# Patient Record
Sex: Female | Born: 1950 | Race: Black or African American | Hispanic: No | Marital: Single | Smoking: Never smoker
Health system: Southern US, Community
[De-identification: ages and names within clinical notes are randomized; demographics above are authoritative.]

## PROBLEM LIST (undated history)

## (undated) DIAGNOSIS — I10 Essential (primary) hypertension: Secondary | ICD-10-CM

## (undated) DIAGNOSIS — K219 Gastro-esophageal reflux disease without esophagitis: Secondary | ICD-10-CM

## (undated) DIAGNOSIS — M4800 Spinal stenosis, site unspecified: Secondary | ICD-10-CM

## (undated) DIAGNOSIS — H269 Unspecified cataract: Secondary | ICD-10-CM

## (undated) DIAGNOSIS — M51369 Other intervertebral disc degeneration, lumbar region without mention of lumbar back pain or lower extremity pain: Secondary | ICD-10-CM

## (undated) DIAGNOSIS — B019 Varicella without complication: Secondary | ICD-10-CM

## (undated) DIAGNOSIS — M5126 Other intervertebral disc displacement, lumbar region: Secondary | ICD-10-CM

## (undated) DIAGNOSIS — M5136 Other intervertebral disc degeneration, lumbar region: Secondary | ICD-10-CM

## (undated) DIAGNOSIS — E119 Type 2 diabetes mellitus without complications: Secondary | ICD-10-CM

## (undated) HISTORY — DX: Other intervertebral disc degeneration, lumbar region: M51.36

## (undated) HISTORY — DX: Varicella without complication: B01.9

## (undated) HISTORY — DX: Unspecified cataract: H26.9

## (undated) HISTORY — DX: Other intervertebral disc degeneration, lumbar region without mention of lumbar back pain or lower extremity pain: M51.369

## (undated) HISTORY — DX: Spinal stenosis, site unspecified: M48.00

## (undated) HISTORY — DX: Other intervertebral disc displacement, lumbar region: M51.26

## (undated) HISTORY — PX: CARPAL TUNNEL RELEASE: SHX101

## (undated) HISTORY — DX: Gastro-esophageal reflux disease without esophagitis: K21.9

---

## 1981-03-10 HISTORY — PX: ABDOMINAL HYSTERECTOMY: SHX81

## 2003-04-23 ENCOUNTER — Ambulatory Visit (HOSPITAL_COMMUNITY): Admission: RE | Admit: 2003-04-23 | Discharge: 2003-04-23 | Payer: Self-pay | Admitting: Orthopedic Surgery

## 2017-12-25 ENCOUNTER — Ambulatory Visit (INDEPENDENT_AMBULATORY_CARE_PROVIDER_SITE_OTHER): Payer: Medicare Other

## 2017-12-25 ENCOUNTER — Other Ambulatory Visit: Payer: Self-pay | Admitting: Internal Medicine

## 2017-12-25 ENCOUNTER — Encounter: Payer: Self-pay | Admitting: Internal Medicine

## 2017-12-25 ENCOUNTER — Ambulatory Visit (INDEPENDENT_AMBULATORY_CARE_PROVIDER_SITE_OTHER): Payer: Medicare Other | Admitting: Internal Medicine

## 2017-12-25 VITALS — BP 140/80 | HR 72 | Temp 98.7°F | Ht 64.0 in | Wt 241.2 lb

## 2017-12-25 DIAGNOSIS — Z1329 Encounter for screening for other suspected endocrine disorder: Secondary | ICD-10-CM

## 2017-12-25 DIAGNOSIS — M5441 Lumbago with sciatica, right side: Secondary | ICD-10-CM | POA: Diagnosis not present

## 2017-12-25 DIAGNOSIS — E559 Vitamin D deficiency, unspecified: Secondary | ICD-10-CM | POA: Insufficient documentation

## 2017-12-25 DIAGNOSIS — R739 Hyperglycemia, unspecified: Secondary | ICD-10-CM

## 2017-12-25 DIAGNOSIS — G8929 Other chronic pain: Secondary | ICD-10-CM

## 2017-12-25 DIAGNOSIS — M5442 Lumbago with sciatica, left side: Secondary | ICD-10-CM

## 2017-12-25 DIAGNOSIS — R609 Edema, unspecified: Secondary | ICD-10-CM

## 2017-12-25 DIAGNOSIS — Z0000 Encounter for general adult medical examination without abnormal findings: Secondary | ICD-10-CM

## 2017-12-25 DIAGNOSIS — Z13220 Encounter for screening for lipoid disorders: Secondary | ICD-10-CM | POA: Diagnosis not present

## 2017-12-25 DIAGNOSIS — E2839 Other primary ovarian failure: Secondary | ICD-10-CM

## 2017-12-25 DIAGNOSIS — Z1159 Encounter for screening for other viral diseases: Secondary | ICD-10-CM

## 2017-12-25 DIAGNOSIS — Z1231 Encounter for screening mammogram for malignant neoplasm of breast: Secondary | ICD-10-CM

## 2017-12-25 DIAGNOSIS — Z13818 Encounter for screening for other digestive system disorders: Secondary | ICD-10-CM

## 2017-12-25 DIAGNOSIS — Z1389 Encounter for screening for other disorder: Secondary | ICD-10-CM

## 2017-12-25 LAB — COMPREHENSIVE METABOLIC PANEL
ALT: 38 U/L — ABNORMAL HIGH (ref 0–35)
AST: 24 U/L (ref 0–37)
Albumin: 4.3 g/dL (ref 3.5–5.2)
Alkaline Phosphatase: 85 U/L (ref 39–117)
BUN: 19 mg/dL (ref 6–23)
CHLORIDE: 104 meq/L (ref 96–112)
CO2: 27 mEq/L (ref 19–32)
Calcium: 9.5 mg/dL (ref 8.4–10.5)
Creatinine, Ser: 0.82 mg/dL (ref 0.40–1.20)
GFR: 89.52 mL/min (ref >60.00)
GLUCOSE: 142 mg/dL — AB (ref 70–99)
POTASSIUM: 4 meq/L (ref 3.5–5.1)
SODIUM: 140 meq/L (ref 135–145)
Total Bilirubin: 0.7 mg/dL (ref 0.2–1.2)
Total Protein: 8.3 g/dL (ref 6.0–8.3)

## 2017-12-25 LAB — CBC WITH DIFFERENTIAL/PLATELET
BASOS PCT: 0.3 % (ref 0.0–3.0)
Basophils Absolute: 0 10*3/uL (ref 0.0–0.1)
EOS PCT: 0.7 % (ref 0.0–5.0)
Eosinophils Absolute: 0.1 10*3/uL (ref 0.0–0.7)
HCT: 38.5 % (ref 36.0–46.0)
HEMOGLOBIN: 12.6 g/dL (ref 12.0–15.0)
Lymphocytes Relative: 13.4 % (ref 12.0–46.0)
Lymphs Abs: 1.2 10*3/uL (ref 0.7–4.0)
MCHC: 32.7 g/dL (ref 30.0–36.0)
MCV: 86.6 fl (ref 78.0–100.0)
MONOS PCT: 5.4 % (ref 3.0–12.0)
Monocytes Absolute: 0.5 10*3/uL (ref 0.1–1.0)
Neutro Abs: 7.1 10*3/uL (ref 1.4–7.7)
Neutrophils Relative %: 80.2 % — ABNORMAL HIGH (ref 43.0–77.0)
Platelets: 315 10*3/uL (ref 150.0–400.0)
RBC: 4.44 Mil/uL (ref 3.87–5.11)
RDW: 14.7 % (ref 11.5–15.5)
WBC: 8.8 10*3/uL (ref 4.0–10.5)

## 2017-12-25 LAB — HEMOGLOBIN A1C: HEMOGLOBIN A1C: 6.5 % (ref 4.6–6.5)

## 2017-12-25 LAB — LIPID PANEL
CHOL/HDL RATIO: 3
Cholesterol: 122 mg/dL (ref 0–200)
HDL: 36.8 mg/dL — ABNORMAL LOW (ref >39.00)
LDL CALC: 75 mg/dL (ref 0–99)
NONHDL: 85.42
Triglycerides: 51 mg/dL (ref 0.0–149.0)
VLDL: 10.2 mg/dL (ref 0.0–40.0)

## 2017-12-25 LAB — TSH: TSH: 1.26 u[IU]/mL (ref 0.35–4.50)

## 2017-12-25 LAB — VITAMIN D 25 HYDROXY (VIT D DEFICIENCY, FRACTURES): VITD: 16.16 ng/mL — ABNORMAL LOW (ref 30.00–100.00)

## 2017-12-25 MED ORDER — CHOLECALCIFEROL 1.25 MG (50000 UT) PO CAPS
50000.0000 [IU] | ORAL_CAPSULE | ORAL | 1 refills | Status: DC
Start: 2017-12-25 — End: 2019-02-18

## 2017-12-25 MED ORDER — FUROSEMIDE 20 MG PO TABS: 10.0000 mg | ORAL_TABLET | Freq: Every day | ORAL | 2 refills | Status: DC | PRN

## 2017-12-25 NOTE — Patient Instructions (Signed)
Tylenol 500 mg up to 6 pills a day is safer  Consider prescription compression stockings if the OTC ones dont work   Paresthesia Paresthesia is an abnormal burning or prickling sensation. This sensation is generally felt in the hands, arms, legs, or feet. However, it may occur in any part of the body. Usually, it is not painful. The feeling may be described as:  Tingling or numbness.  Pins and needles.  Skin crawling.  Buzzing.  Limbs falling asleep.  Itching.  Most people experience temporary (transient) paresthesia at some time in their lives. Paresthesia may occur when you breathe too quickly (hyperventilation). It can also occur without any apparent cause. Commonly, paresthesia occurs when pressure is placed on a nerve. The sensation quickly goes away after the pressure is removed. For some people, however, paresthesia is a long-lasting (chronic) condition that is caused by an underlying disorder. If you continue to have paresthesia, you may need further medical evaluation. Follow these instructions at home: Watch your condition for any changes. Taking the following actions may help to lessen any discomfort that you are feeling:  Avoid drinking alcohol.  Try acupuncture or massage to help relieve your symptoms.  Keep all follow-up visits as directed by your health care provider. This is important.  Contact a health care provider if:  You continue to have episodes of paresthesia.  Your burning or prickling feeling gets worse when you walk.  You have pain, cramps, or dizziness.  You develop a rash. Get help right away if:  You feel weak.  You have trouble walking or moving.  You have problems with speech, understanding, or vision.  You feel confused.  You cannot control your bladder or bowel movements.  You have numbness after an injury.  You faint. This information is not intended to replace advice given to you by your health care provider. Make sure you discuss  any questions you have with your health care provider. Document Released: 02/14/2002 Document Revised: 08/02/2015 Document Reviewed: 02/20/2014 Elsevier Interactive Patient Education  2018 [place].   Pneumococcal Conjugate Vaccine suspension for injection What is this medicine? PNEUMOCOCCAL VACCINE (NEU mo KOK al vak SEEN) is a vaccine used to prevent pneumococcus bacterial infections. These bacteria can cause serious infections like pneumonia, meningitis, and blood infections. This vaccine will lower your chance of getting pneumonia. If you do get pneumonia, it can make your symptoms milder and your illness shorter. This vaccine will not treat an infection and will not cause infection. This vaccine is recommended for infants and young children, adults with certain medical conditions, and adults 66 years or older. This medicine may be used for other purposes; ask your health care provider or pharmacist if you have questions. COMMON BRAND NAME(S): Prevnar, Prevnar 13 What should I tell my health care provider before I take this medicine? They need to know if you have any of these conditions: -bleeding problems -fever -immune system problems -an unusual or allergic reaction to pneumococcal vaccine, diphtheria toxoid, other vaccines, latex, other medicines, foods, dyes, or preservatives -pregnant or trying to get pregnant -breast-feeding How should I use this medicine? This vaccine is for injection into a muscle. It is given by a health care professional. A copy of Vaccine Information Statements will be given before each vaccination. Read this sheet carefully each time. The sheet may change frequently. Talk to your pediatrician regarding the use of this medicine in children. While this drug may be prescribed for children as young as 1 weeks old  for selected conditions, precautions do apply. Overdosage: If you think you have taken too much of this medicine contact a poison control center or  emergency room at once. NOTE: This medicine is only for you. Do not share this medicine with others. What if I miss a dose? It is important not to miss your dose. Call your doctor or health care professional if you are unable to keep an appointment. What may interact with this medicine? -medicines for cancer chemotherapy -medicines that suppress your immune function -steroid medicines like prednisone or cortisone This list may not describe all possible interactions. Give your health care provider a list of all the medicines, herbs, non-prescription drugs, or dietary supplements you use. Also tell them if you smoke, drink alcohol, or use illegal drugs. Some items may interact with your medicine. What should I watch for while using this medicine? Mild fever and pain should go away in 3 days or less. Report any unusual symptoms to your doctor or health care professional. What side effects may I notice from receiving this medicine? Side effects that you should report to your doctor or health care professional as soon as possible: -allergic reactions like skin rash, itching or hives, swelling of the face, lips, or tongue -breathing problems -confused -fast or irregular heartbeat -fever over 102 degrees F -seizures -unusual bleeding or bruising -unusual muscle weakness Side effects that usually do not require medical attention (report to your doctor or health care professional if they continue or are bothersome): -aches and pains -diarrhea -fever of 102 degrees F or less -headache -irritable -loss of appetite -pain, tender at site where injected -trouble sleeping This list may not describe all possible side effects. Call your doctor for medical advice about side effects. You may report side effects to FDA at 1-800-FDA-1088. Where should I keep my medicine? This does not apply. This vaccine is given in a clinic, pharmacy, doctor's office, or other health care setting and will not be stored  at home. NOTE: This sheet is a summary. It may not cover all possible information. If you have questions about this medicine, talk to your doctor, pharmacist, or health care provider.  2018 Elsevier/Gold Standard (2013-12-01 10:27:27)  Recombinant Zoster (Shingles) Vaccine, RZV: What You Need to Know 1. Why get vaccinated? Shingles (also called herpes zoster, or just zoster) is a painful skin rash, often with blisters. Shingles is caused by the varicella zoster virus, the same virus that causes chickenpox. After you have chickenpox, the virus stays in your body and can cause shingles later in life. You can't catch shingles from another person. However, a person who has never had chickenpox (or chickenpox vaccine) could get chickenpox from someone with shingles. A shingles rash usually appears on one side of the face or body and heals within 2 to 4 weeks. Its main symptom is pain, which can be severe. Other symptoms can include fever, headache, chills and upset stomach. Very rarely, a shingles infection can lead to pneumonia, hearing problems, blindness, brain inflammation (encephalitis), or death. For about 1 person in 5, severe pain can continue even long after the rash has cleared up. This long-lasting pain is called post-herpetic neuralgia (PHN). Shingles is far more common in people 57 years of age and older than in younger people, and the risk increases with age. It is also more common in people whose immune system is weakened because of a disease such as cancer, or by drugs such as steroids or chemotherapy. At least 1 million people  a year in the [place] get shingles. 2. Shingles vaccine (recombinant) Recombinant shingles vaccine was approved by FDA in 2017 for the prevention of shingles. In clinical trials, it was more than 90% effective in preventing shingles. It can also reduce the likelihood of PHN. Two doses, 2 to 6 months apart, are recommended for adults 37 and older. This vaccine  is also recommended for people who have already gotten the live shingles vaccine (Zostavax). There is no live virus in this vaccine. 3. Some people should not get this vaccine Tell your vaccine provider if you:  Have any severe, life-threatening allergies. A person who has ever had a life-threatening allergic reaction after a dose of recombinant shingles vaccine, or has a severe allergy to any component of this vaccine, may be advised not to be vaccinated. Ask your health care provider if you want information about vaccine components.  Are pregnant or breastfeeding. There is not much information about use of recombinant shingles vaccine in pregnant or nursing women. Your healthcare provider might recommend delaying vaccination.  Are not feeling well. If you have a mild illness, such as a cold, you can probably get the vaccine today. If you are moderately or severely ill, you should probably wait until you recover. Your doctor can advise you.  4. Risks of a vaccine reaction With any medicine, including vaccines, there is a chance of reactions. After recombinant shingles vaccination, a person might experience:  Pain, redness, soreness, or swelling at the site of the injection  Headache, muscle aches, fever, shivering, fatigue  In clinical trials, most people got a sore arm with mild or moderate pain after vaccination, and some also had redness and swelling where they got the shot. Some people felt tired, had muscle pain, a headache, shivering, fever, stomach pain, or nausea. About 1 out of 6 people who got recombinant zoster vaccine experienced side effects that prevented them from doing regular activities. Symptoms went away on their own in about 2 to 3 days. Side effects were more common in younger people. You should still get the second dose of recombinant zoster vaccine even if you had one of these reactions after the first dose. Other things that could happen after this vaccine:  People  sometimes faint after medical procedures, including vaccination. Sitting or lying down for about 15 minutes can help prevent fainting and injuries caused by a fall. Tell your provider if you feel dizzy or have vision changes or ringing in the ears.  Some people get shoulder pain that can be more severe and longer-lasting than routine soreness that can follow injections. This happens very rarely.  Any medication can cause a severe allergic reaction. Such reactions to a vaccine are estimated at about 1 in a million doses, and would happen within a few minutes to a few hours after the vaccination. As with any medicine, there is a very remote chance of a vaccine causing a serious injury or death. The safety of vaccines is always being monitored. For more information, visit: http://www.aguilar.org/ 5. What if there is a serious problem? What should I look for?  Look for anything that concerns you, such as signs of a severe allergic reaction, very high fever, or unusual behavior. Signs of a severe allergic reaction can include hives, swelling of the face and throat, difficulty breathing, a fast heartbeat, dizziness, and weakness. These would usually start a few minutes to a few hours after the vaccination. What should I do?  If you think it is  a severe allergic reaction or other emergency that can't wait, call 9-1-1 and get to the nearest hospital. Otherwise, call your health care provider. Afterward, the reaction should be reported to the Vaccine Adverse Event Reporting System (VAERS). Your doctor should file this report, or you can do it yourself through the VAERS web site atwww.vaers.https://www.bray.com/ by calling 360-034-9911. VAERS does not give medical advice. 6. How can I learn more?  Ask your healthcare provider. He or she can give you the vaccine package insert or suggest other sources of information.  Call your local or state health department.  Contact the Centers for Disease Control and  Prevention (CDC): ? Call 619-756-8072 (1-800-CDC-INFO) or ? Visit the CDC's website at http://hunter.com/ CDC Vaccine Information Statement (VIS) Recombinant Zoster Vaccine (04/21/2016) This information is not intended to replace advice given to you by your health care provider. Make sure you discuss any questions you have with your health care provider. Document Released: 05/06/2016 Document Revised: 05/06/2016 Document Reviewed: 05/06/2016 Elsevier Interactive Patient Education  2018 [employer].  DTaP Vaccine (Diphtheria, Tetanus, and Pertussis): What You Need to Know 1. Why get vaccinated? Diphtheria, tetanus, and pertussis are serious diseases caused by bacteria. Diphtheria and pertussis are spread from person to person. Tetanus enters the body through cuts or wounds. DIPHTHERIA causes a thick covering in the back of the throat.  It can lead to breathing problems, paralysis, heart failure, and even death.  TETANUS (Lockjaw) causes painful tightening of the muscles, usually all over the body.  It can lead to "locking" of the jaw so the victim cannot open his mouth or swallow. Tetanus leads to death in up to 2 out of 10 cases.  PERTUSSIS (Whooping Cough) causes coughing spells so bad that it is hard for infants to eat, drink, or breathe. These spells can last for weeks.  It can lead to pneumonia, seizures (jerking and staring spells), brain damage, and death.  Diphtheria, tetanus, and pertussis vaccine (DTaP) can help prevent these diseases. Most children who are vaccinated with DTaP will be protected throughout childhood. Many more children would get these diseases if we stopped vaccinating. DTaP is a safer version of an older vaccine called DTP. DTP is no longer used in the [place]. 2. Who should get DTaP vaccine and when? Children should get 5 doses of DTaP vaccine, one dose at each of the following ages:  2 months  4 months  6 months  15-18 months  4-6  years  DTaP may be given at the same time as other vaccines. 3. Some children should not get DTaP vaccine or should wait  Children with minor illnesses, such as a cold, may be vaccinated. But children who are moderately or severely ill should usually wait until they recover before getting DTaP vaccine.  Any child who had a life-threatening allergic reaction after a dose of DTaP should not get another dose.  Any child who suffered a brain or nervous system disease within 7 days after a dose of DTaP should not get another dose.  Talk with your doctor if your child: ? had a seizure or collapsed after a dose of DTaP, ? cried non-stop for 3 hours or more after a dose of DTaP, ? had a fever over 105F after a dose of DTaP. Ask your doctor for more information. Some of these children should not get another dose of pertussis vaccine, but may get a vaccine without pertussis, called DT. 4. Older children and adults DTaP is not licensed  for adolescents, adults, or children 58 years of age and older. But older people still need protection. A vaccine called Tdap is similar to DTaP. A single dose of Tdap is recommended for people 11 through 66 years of age. Another vaccine, called Td, protects against tetanus and diphtheria, but not pertussis. It is recommended every 10 years. There are separate Vaccine Information Statements for these vaccines. 5. What are the risks from DTaP vaccine? Getting diphtheria, tetanus, or pertussis disease is much riskier than getting DTaP vaccine. However, a vaccine, like any medicine, is capable of causing serious problems, such as severe allergic reactions. The risk of DTaP vaccine causing serious harm, or death, is extremely small. Mild problems (common)  Fever (up to about 1 child in 4)  Redness or swelling where the shot was given (up to about 1 child in 4)  Soreness or tenderness where the shot was given (up to about 1 child in 4) These problems occur more often  after the 4th and 5th doses of the DTaP series than after earlier doses. Sometimes the 4th or 5th dose of DTaP vaccine is followed by swelling of the entire arm or leg in which the shot was given, lasting 1-7 days (up to about 1 child in 55). Other mild problems include:  Fussiness (up to about 1 child in 3)  Tiredness or poor appetite (up to about 1 child in 10)  Vomiting (up to about 1 child in 66) These problems generally occur 1-3 days after the shot. Moderate problems (uncommon)  Seizure (jerking or staring) (about 1 child out of 14,000)  Non-stop crying, for 3 hours or more (up to about 1 child out of 1,000)  High fever, over 105F (about 1 child out of 16,000) Severe problems (very rare)  Serious allergic reaction (less than 1 out of a million doses)  Several other severe problems have been reported after DTaP vaccine. These include: ? Long-term seizures, coma, or lowered consciousness ? Permanent brain damage. These are so rare it is hard to tell if they are caused by the vaccine. Controlling fever is especially important for children who have had seizures, for any reason. It is also important if another family member has had seizures. You can reduce fever and pain by giving your child an aspirin-free pain reliever when the shot is given, and for the next 24 hours, following the package instructions. 6. What if there is a serious reaction? What should I look for? Look for anything that concerns you, such as signs of a severe allergic reaction, very high fever, or behavior changes. Signs of a severe allergic reaction can include hives, swelling of the face and throat, difficulty breathing, a fast heartbeat, dizziness, and weakness. These would start a few minutes to a few hours after the vaccination. What should I do?  If you think it is a severe allergic reaction or other emergency that can't wait, call 9-1-1 or get the person to the nearest hospital. Otherwise, call your  doctor.  Afterward, the reaction should be reported to the Vaccine Adverse Event Reporting System (VAERS). Your doctor might file this report, or you can do it yourself through the VAERS web site at www.vaers.SamedayNews.es, or by calling 838-753-7832. ? VAERS is only for reporting reactions. They do not give medical advice. 7. The National Vaccine Injury Compensation Program The [employer] Vaccine Injury Compensation Program (VICP) is a federal program that was created to compensate people who may have been injured by certain vaccines. Persons who  believe they may have been injured by a vaccine can learn about the program and about filing a claim by calling 305 393 2535 or visiting the [employer] website at GoldCloset.com.ee. 8. How can I learn more?  Ask your doctor.  Call your local or state health department.  Contact the Centers for Disease Control and Prevention (CDC): ? Call (618)206-5841 (1-800-CDC-INFO) or ? Visit CDC's website at http://hunter.com/ CDC DTaP Vaccine (Diphtheria, Tetanus, and Pertussis) VIS (07/24/05) This information is not intended to replace advice given to you by your health care provider. Make sure you discuss any questions you have with your health care provider. Document Released: 12/22/2005 Document Revised: 11/15/2015 Document Reviewed: 11/15/2015 Elsevier Interactive Patient Education  2017 [employer]  Edema Edema is an abnormal buildup of fluids in your bodytissues. Edema is somewhatdependent on gravity to pull the fluid to the lowest place in your body. That makes the condition more common in the legs and thighs (lower extremities). Painless swelling of the feet and ankles is common and becomes more likely as you get older. It is also common in looser tissues, like around your eyes. When the affected area is squeezed, the fluid may move out of that spot and leave a dent for a few moments. This dent is called pitting. What are the causes? There  are many possible causes of edema. Eating too much salt and being on your feet or sitting for a long time can cause edema in your legs and ankles. Hot weather may make edema worse. Common medical causes of edema include:  Heart failure.  Liver disease.  Kidney disease.  Weak blood vessels in your legs.  Cancer.  An injury.  Pregnancy.  Some medications.  Obesity.  What are the signs or symptoms? Edema is usually painless.Your skin may look swollen or shiny. How is this diagnosed? Your health care provider may be able to diagnose edema by asking about your medical history and doing a physical exam. You may need to have tests such as X-rays, an electrocardiogram, or blood tests to check for medical conditions that may cause edema. How is this treated? Edema treatment depends on the cause. If you have heart, liver, or kidney disease, you need the treatment appropriate for these conditions. General treatment may include:  Elevation of the affected body part above the level of your heart.  Compression of the affected body part. Pressure from elastic bandages or support stockings squeezes the tissues and forces fluid back into the blood vessels. This keeps fluid from entering the tissues.  Restriction of fluid and salt intake.  Use of a water pill (diuretic). These medications are appropriate only for some types of edema. They pull fluid out of your body and make you urinate more often. This gets rid of fluid and reduces swelling, but diuretics can have side effects. Only use diuretics as directed by your health care provider.  Follow these instructions at home:  Keep the affected body part above the level of your heart when you are lying down.  Do not sit still or stand for prolonged periods.  Do not put anything directly under your knees when lying down.  Do not wear constricting clothing or garters on your upper legs.  Exercise your legs to work the fluid back into your  blood vessels. This may help the swelling go down.  Wear elastic bandages or support stockings to reduce ankle swelling as directed by your health care provider.  Eat a low-salt diet to reduce fluid if  your health care provider recommends it.  Only take medicines as directed by your health care provider. Contact a health care provider if:  Your edema is not responding to treatment.  You have heart, liver, or kidney disease and notice symptoms of edema.  You have edema in your legs that does not improve after elevating them.  You have sudden and unexplained weight gain. Get help right away if:  You develop shortness of breath or chest pain.  You cannot breathe when you lie down.  You develop pain, redness, or warmth in the swollen areas.  You have heart, liver, or kidney disease and suddenly get edema.  You have a fever and your symptoms suddenly get worse. This information is not intended to replace advice given to you by your health care provider. Make sure you discuss any questions you have with your health care provider. Document Released: 02/24/2005 Document Revised: 08/02/2015 Document Reviewed: 12/17/2012 Elsevier Interactive Patient Education  2017 [employer].

## 2017-12-25 NOTE — Progress Notes (Signed)
Chief Complaint  Patient presents with  . Establish Care   NP 1. C/o intermittent low back pain worse x 1-2 years 8/10 worse days with numbness and tingling b/l legs/feet no other alarm sx's saddle anesthesia, bowel/bladder incontinence, falls/leg weakness tried Aleve  2. C/o leg edema intermittently nothing tried     Review of Systems  Constitutional: Negative for weight loss.  HENT: Negative for hearing loss.   Eyes: Negative for blurred vision.  Respiratory: Negative for shortness of breath.   Cardiovascular: Positive for leg swelling. Negative for chest pain.  Gastrointestinal: Negative for abdominal pain.  Musculoskeletal: Negative for falls.  Skin: Negative for rash.  Neurological: Positive for sensory change.  Psychiatric/Behavioral: Negative for depression.   Past Medical History:  Diagnosis Date  . Chickenpox   . GERD (gastroesophageal reflux disease)    Past Surgical History:  Procedure Laterality Date  . ABDOMINAL HYSTERECTOMY  1983   Family History  Problem Relation Age of Onset  . Cancer Father        pancreatic cancer   . Cancer Sister        breast cancer age 1s  . Diabetes Sister   . Cancer Brother        pancreatic cancer  . Stroke Brother        died stroke  . ALS Mother        32s   Social History   Socioeconomic History  . Marital status: Married    Spouse name: Not on file  . Number of children: Not on file  . Years of education: Not on file  . Highest education level: Not on file  Occupational History  . Not on file  Social Needs  . Financial resource strain: Not on file  . Food insecurity:    Worry: Not on file    Inability: Not on file  . Transportation needs:    Medical: Not on file    Non-medical: Not on file  Tobacco Use  . Smoking status: Never Smoker  . Smokeless tobacco: Never Used  Substance and Sexual Activity  . Alcohol use: Yes    Comment: social  . Drug use: Never  . Sexual activity: Not Currently  Lifestyle   . Physical activity:    Days per week: Not on file    Minutes per session: Not on file  . Stress: Not on file  Relationships  . Social connections:    Talks on phone: Not on file    Gets together: Not on file    Attends religious service: Not on file    Active member of club or organization: Not on file    Attends meetings of clubs or organizations: Not on file    Relationship status: Not on file  . Intimate partner violence:    Fear of current or ex partner: Not on file    Emotionally abused: Not on file    Physically abused: Not on file    Forced sexual activity: Not on file  Other Topics Concern  . 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 naproxen sodium (ALEVE) 220 MG tablet Take 220 mg by mouth.  No Known Allergies No results found for this or any previous visit (from the past 2160 hour(s)). Objective  Body mass index is 41.4 kg/m. Wt Readings from Last 3 Encounters:  12/25/17 241 lb 3.2 oz (109.4 kg)   Temp Readings from Last 3 Encounters:  12/25/17 98.7 F (37.1 C) (Oral)   BP Readings from Last 3 Encounters:  12/25/17 140/80   Pulse Readings from Last 3 Encounters:  12/25/17 72    Physical Exam  Constitutional: She is oriented to person, place, and time. Vital signs are normal. She appears well-developed and well-nourished. She is cooperative.  HENT:  Head: Normocephalic and atraumatic.  Mouth/Throat: Oropharynx is clear and moist and mucous membranes are normal.  Eyes: Pupils are equal, round, and reactive to light. Conjunctivae are normal.  Cardiovascular: Normal rate, regular rhythm and normal heart sounds.  No leg edema today   Pulmonary/Chest: Effort normal and breath sounds normal.  Musculoskeletal:       Lumbar back: She exhibits no tenderness.   Neg str8 leg test b/l   Neurological: She is alert and oriented to person, place, and time. Gait normal.  Skin: Skin is warm, dry and intact.  Psychiatric: She has a normal mood and affect. Her speech is normal and behavior is normal. Judgment and thought content normal. Cognition and memory are normal.  Nursing note and vitals reviewed.   Assessment   1. Numbness tingling b/l legs r/o back etiology, DM 2 2. Leg edema  3. HM Plan   1. Low back Xray today  Fasting labs  2. Prn lasix 10 mg qd prn disc otc compression  3.  Declines flu shot, Tdap Disc shingles, prevnar consider pna 23 given info   Referred mammogram and DEXA GI breast center Records colonoscopy [facility]  Pap s/p hysterectomy no h/o abnormal pap last 6-7 years ago ovaries intact  Never smoker   Provider: Dr. Olivia Phillips [facility]

## 2017-12-26 LAB — HEPATITIS C ANTIBODY
Hepatitis C Ab: NONREACTIVE
SIGNAL TO CUT-OFF: 0.03 (ref <1.00)

## 2017-12-26 LAB — URINALYSIS, ROUTINE W REFLEX MICROSCOPIC
BILIRUBIN URINE: NEGATIVE
GLUCOSE, UA: NEGATIVE
HGB URINE DIPSTICK: NEGATIVE
KETONES UR: NEGATIVE
Leukocytes, UA: NEGATIVE
Nitrite: NEGATIVE
Protein, ur: NEGATIVE
Specific Gravity, Urine: 1.031 (ref 1.001–1.03)
pH: 5.5 (ref 5.0–8.0)

## 2017-12-26 LAB — HEPATITIS B SURFACE ANTIBODY, QUANTITATIVE: Hepatitis B-Post: <5 m[IU]/mL — ABNORMAL LOW (ref >=10)

## 2017-12-28 ENCOUNTER — Telehealth: Payer: Self-pay | Admitting: Internal Medicine

## 2017-12-28 ENCOUNTER — Other Ambulatory Visit: Payer: Self-pay | Admitting: Internal Medicine

## 2017-12-28 DIAGNOSIS — M5416 Radiculopathy, lumbar region: Secondary | ICD-10-CM

## 2017-12-28 DIAGNOSIS — E119 Type 2 diabetes mellitus without complications: Secondary | ICD-10-CM | POA: Insufficient documentation

## 2017-12-28 DIAGNOSIS — R748 Abnormal levels of other serum enzymes: Secondary | ICD-10-CM | POA: Insufficient documentation

## 2017-12-28 MED ORDER — METFORMIN HCL 500 MG PO TABS: 500.0000 mg | ORAL_TABLET | Freq: Every day | ORAL | 3 refills | Status: DC

## 2017-12-28 NOTE — Telephone Encounter (Signed)
See result note.  

## 2017-12-28 NOTE — Telephone Encounter (Signed)
Copied from [place] [id number]. Topic: Quick Communication - Lab Results (Clinic Use ONLY) >> Dec 28, 2017 11:00 AM Babs Bertin, CMA wrote: Called patient to inform them of 21OCT2019 lab results. When patient returns call, triage nurse may disclose results.   Patient is calling and states to please call after 3:30pm.

## 2018-01-01 ENCOUNTER — Ambulatory Visit
Admission: RE | Admit: 2018-01-01 | Discharge: 2018-01-01 | Disposition: A | Discharge status: Home / Self Care | Payer: Medicare Other | Source: Ambulatory Visit | Attending: Internal Medicine | Admitting: Internal Medicine

## 2018-01-01 DIAGNOSIS — R748 Abnormal levels of other serum enzymes: Secondary | ICD-10-CM

## 2018-01-03 ENCOUNTER — Ambulatory Visit
Admission: RE | Admit: 2018-01-03 | Discharge: 2018-01-03 | Disposition: A | Discharge status: Home / Self Care | Payer: Medicare Other | Source: Ambulatory Visit | Attending: Internal Medicine | Admitting: Internal Medicine

## 2018-01-03 DIAGNOSIS — M5416 Radiculopathy, lumbar region: Secondary | ICD-10-CM

## 2018-02-03 ENCOUNTER — Ambulatory Visit
Admission: RE | Admit: 2018-02-03 | Discharge: 2018-02-03 | Disposition: A | Discharge status: Home / Self Care | Payer: Medicare Other | Source: Ambulatory Visit | Attending: Internal Medicine | Admitting: Internal Medicine

## 2018-02-03 ENCOUNTER — Encounter: Payer: Self-pay | Admitting: Internal Medicine

## 2018-02-03 ENCOUNTER — Ambulatory Visit (INDEPENDENT_AMBULATORY_CARE_PROVIDER_SITE_OTHER): Payer: Medicare Other | Admitting: Internal Medicine

## 2018-02-03 ENCOUNTER — Telehealth: Payer: Self-pay | Admitting: Internal Medicine

## 2018-02-03 VITALS — BP 134/62 | HR 64 | Temp 98.0°F | Ht 64.0 in | Wt 237.8 lb

## 2018-02-03 DIAGNOSIS — Z6841 Body Mass Index (BMI) 40.0 and over, adult: Secondary | ICD-10-CM

## 2018-02-03 DIAGNOSIS — R937 Abnormal findings on diagnostic imaging of other parts of musculoskeletal system: Secondary | ICD-10-CM | POA: Diagnosis not present

## 2018-02-03 DIAGNOSIS — E2839 Other primary ovarian failure: Secondary | ICD-10-CM

## 2018-02-03 DIAGNOSIS — K760 Fatty (change of) liver, not elsewhere classified: Secondary | ICD-10-CM

## 2018-02-03 DIAGNOSIS — Z1231 Encounter for screening mammogram for malignant neoplasm of breast: Secondary | ICD-10-CM

## 2018-02-03 DIAGNOSIS — M5416 Radiculopathy, lumbar region: Secondary | ICD-10-CM | POA: Insufficient documentation

## 2018-02-03 DIAGNOSIS — M5136 Other intervertebral disc degeneration, lumbar region: Secondary | ICD-10-CM | POA: Diagnosis not present

## 2018-02-03 DIAGNOSIS — M48062 Spinal stenosis, lumbar region with neurogenic claudication: Secondary | ICD-10-CM

## 2018-02-03 DIAGNOSIS — M4800 Spinal stenosis, site unspecified: Secondary | ICD-10-CM | POA: Insufficient documentation

## 2018-02-03 DIAGNOSIS — R609 Edema, unspecified: Secondary | ICD-10-CM

## 2018-02-03 DIAGNOSIS — E559 Vitamin D deficiency, unspecified: Secondary | ICD-10-CM

## 2018-02-03 NOTE — Telephone Encounter (Signed)
Copied from [place] [id number]. Topic: General - Other >> Feb 03, 2018  5:11 PM Yvette Rack wrote: Reason for CRM: Pt returned call for lab results. Pt requests call back. Cb# [phone]

## 2018-02-03 NOTE — Progress Notes (Signed)
Chief Complaint  Patient presents with  . Follow-up   F/u  1. Reviewed MRI abnormal herniated disc, DDD, severe spinal stenosis lumbar having numbness in b/l legs back pain worse with standing long periods  2. DM 2 on metformin 500 mg qd tolerating a1C 6.5 disc cinnamon to help with blood sugars  3 edema not tried lasix 10 mg qd yet  4. Vit D def on Vitamin D weekly    Review of Systems  Constitutional: Negative for weight loss.  HENT: Negative for hearing loss.   Eyes: Negative for blurred vision.  Respiratory: Negative for shortness of breath.   Cardiovascular: Negative for chest pain.  Gastrointestinal: Negative for abdominal pain.  Musculoskeletal: Positive for back pain.  Skin: Negative for rash.  Neurological: Positive for sensory change.  Psychiatric/Behavioral: Negative for depression.   Past Medical History:  Diagnosis Date  . Chickenpox   . GERD (gastroesophageal reflux disease)    Past Surgical History:  Procedure Laterality Date  . ABDOMINAL HYSTERECTOMY  1983   fibroids    Family History  Problem Relation Age of Onset  . Cancer Father        pancreatic cancer   . Cancer Sister        breast cancer age 61s  . Diabetes Sister   . Cancer Brother        pancreatic cancer  . Stroke Brother        died stroke  . ALS Mother        25s   Social History   Socioeconomic History  . Marital status: Married    Spouse name: Not on file  . Number of children: Not on file  . Years of education: Not on file  . Highest education level: Not on file  Occupational History  . Not on file  Social Needs  . Financial resource strain: Not on file  . Food insecurity:    Worry: Not on file    Inability: Not on file  . Transportation needs:    Medical: Not on file    Non-medical: Not on file  Tobacco Use  . Smoking status: Never Smoker  . Smokeless tobacco: Never Used  Substance and Sexual Activity  . Alcohol use: Yes    Comment: social  . Drug use: Never  .  Sexual activity: Not Currently  Lifestyle  . Physical activity:    Days per week: Not on file    Minutes per session: Not on file  . Stress: Not on file  Relationships  . Social connections:    Talks on phone: Not on file    Gets together: Not on file    Attends religious service: Not on file    Active member of club or organization: Not on file    Attends meetings of clubs or organizations: Not on file    Relationship status: Not on file  . Intimate partner violence:    Fear of current or ex partner: Not on file    Emotionally abused: Not on file    Physically abused: Not on file    Forced sexual activity: Not on file  Other Topics Concern  . 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 Cholecalciferol 50000 units capsule Take 1 capsule (50,000 Units total) by mouth once a week.  . furosemide (LASIX) 20 MG tablet Take 0.5 tablets (10 mg total) by mouth daily as needed.  . metFORMIN (GLUCOPHAGE) 500 MG tablet Take 1 tablet (500 mg total) by mouth daily with breakfast. [name]  . naproxen sodium (ALEVE) 220 MG tablet Take 220 mg by mouth.   No Known Allergies Recent Results (from the past 2160 hour(s))  Comprehensive metabolic panel     Status: Abnormal   Collection Time: 12/25/17 10:20 AM  Result Value Ref Range   Sodium 140 135 - 145 mEq/L   Potassium 4.0 3.5 - 5.1 mEq/L   Chloride 104 96 - 112 mEq/L   CO2 27 19 - 32 mEq/L   Glucose, Bld 142 (H) 70 - 99 mg/dL   BUN 19 6 - 23 mg/dL   Creatinine, Ser 0.82 0.40 - 1.20 mg/dL   Total Bilirubin 0.7 0.2 - 1.2 mg/dL   Alkaline Phosphatase 85 39 - 117 U/L   AST 24 0 - 37 U/L   ALT 38 (H) 0 - 35 U/L   Total Protein 8.3 6.0 - 8.3 g/dL   Albumin 4.3 3.5 - 5.2 g/dL   Calcium 9.5 8.4 - 10.5 mg/dL   GFR 89.52 >60.00 mL/min  CBC with  Differential/Platelet     Status: Abnormal   Collection Time: 12/25/17 10:20 AM  Result Value Ref Range   WBC 8.8 4.0 - 10.5 K/uL   RBC 4.44 3.87 - 5.11 Mil/uL   Hemoglobin 12.6 12.0 - 15.0 g/dL   HCT 38.5 36.0 - 46.0 %   MCV 86.6 78.0 - 100.0 fl   MCHC 32.7 30.0 - 36.0 g/dL   RDW 14.7 11.5 - 15.5 %   Platelets 315.0 150.0 - 400.0 K/uL   Neutrophils Relative % 80.2 (H) 43.0 - 77.0 %   Lymphocytes Relative 13.4 12.0 - 46.0 %   Monocytes Relative 5.4 3.0 - 12.0 %   Eosinophils Relative 0.7 0.0 - 5.0 %   Basophils Relative 0.3 0.0 - 3.0 %   Neutro Abs 7.1 1.4 - 7.7 K/uL   Lymphs Abs 1.2 0.7 - 4.0 K/uL   Monocytes Absolute 0.5 0.1 - 1.0 K/uL   Eosinophils Absolute 0.1 0.0 - 0.7 K/uL   Basophils Absolute 0.0 0.0 - 0.1 K/uL  Lipid panel     Status: Abnormal   Collection Time: 12/25/17 10:20 AM  Result Value Ref Range   Cholesterol 122 0 - 200 mg/dL    Comment: ATP III Classification       Desirable:  < 200 mg/dL               Borderline High:  200 - 239 mg/dL          High:  > = 240 mg/dL   Triglycerides 51.0 0.0 - 149.0 mg/dL    Comment: Normal:  <150 mg/dLBorderline High:  150 - 199 mg/dL   HDL 36.80 (L) >39.00 mg/dL   VLDL 10.2 0.0 - 40.0 mg/dL   LDL Cholesterol 75 0 - 99 mg/dL   Total CHOL/HDL Ratio 3     Comment:                Men          Women1/2 Average Risk     3.4          3.3Average Risk  5.0          4.42X Average Risk          9.6          7.13X Average Risk          15.0          11.0                       NonHDL 85.42     Comment: NOTE:  Non-HDL goal should be 30 mg/dL higher than patient's LDL goal (i.e. LDL goal of < 70 mg/dL, would have non-HDL goal of < 100 mg/dL)  TSH     Status: None   Collection Time: 12/25/17 10:20 AM  Result Value Ref Range   TSH 1.26 0.35 - 4.50 uIU/mL  Urinalysis, Routine w reflex microscopic     Status: Abnormal   Collection Time: 12/25/17 10:20 AM  Result Value Ref Range   Color, Urine YELLOW YELLOW   APPearance CLOUDY (A)  CLEAR   Specific Gravity, Urine 1.031 1.001 - 1.03   pH 5.5 5.0 - 8.0   Glucose, UA NEGATIVE NEGATIVE   Bilirubin Urine NEGATIVE NEGATIVE   Ketones, ur NEGATIVE NEGATIVE   Hgb urine dipstick NEGATIVE NEGATIVE   Protein, ur NEGATIVE NEGATIVE   Nitrite NEGATIVE NEGATIVE   Leukocytes, UA NEGATIVE NEGATIVE  Hemoglobin A1c     Status: None   Collection Time: 12/25/17 10:20 AM  Result Value Ref Range   Hgb A1c MFr Bld 6.5 4.6 - 6.5 %    Comment: Glycemic Control Guidelines for People with Diabetes:Non Diabetic:  <6%Goal of Therapy: <7%Additional Action Suggested:  >8%   Vitamin D (25 hydroxy)     Status: Abnormal   Collection Time: 12/25/17 10:20 AM  Result Value Ref Range   VITD 16.16 (L) 30.00 - 100.00 ng/mL  Hepatitis B surface antibody,quantitative     Status: Abnormal   Collection Time: 12/25/17 10:20 AM  Result Value Ref Range   Hepatitis B-Post <5 (L) > OR = 10 mIU/mL    Comment: . Patient does not have immunity to hepatitis B virus. . For additional information, please refer to http://education.[facility].com/faq/FAQ105 (This link is being provided for informational/ educational purposes only).   Hepatitis C antibody     Status: None   Collection Time: 12/25/17 10:20 AM  Result Value Ref Range   Hepatitis C Ab NON-REACTIVE NON-REACTI   SIGNAL TO CUT-OFF 0.03 <1.00    Comment: . HCV antibody was non-reactive. There is no laboratory  evidence of HCV infection. . In most cases, no further action is required. However, if recent HCV exposure is suspected, a test for HCV RNA (test code [phone]) is suggested. . For additional information please refer to http://education.[facility].com/faq/FAQ22v1 (This link is being provided for informational/ educational purposes only.) .    Objective  Body mass index is 40.82 kg/m. Wt Readings from Last 3 Encounters:  02/03/18 237 lb 12.8 oz (107.9 kg)  12/25/17 241 lb 3.2 oz (109.4 kg)   Temp Readings from Last 3  Encounters:  02/03/18 98 F (36.7 C) (Oral)  12/25/17 98.7 F (37.1 C) (Oral)   BP Readings from Last 3 Encounters:  02/03/18 134/62  12/25/17 140/80   Pulse Readings from Last 3 Encounters:  02/03/18 64  12/25/17 72    Physical Exam  Constitutional: She is oriented to person, place, and time. Vital signs are normal. She appears well-developed and well-nourished. She is cooperative.  HENT:  Head: Normocephalic and atraumatic.  Mouth/Throat: Oropharynx is clear and moist and mucous membranes are normal.  Eyes: Pupils are equal, round, and reactive to light. Conjunctivae are normal.  Cardiovascular: Normal rate, regular rhythm and normal heart sounds.  Pulmonary/Chest: Effort normal and breath sounds normal.  Neurological: She is alert and oriented to person, place, and time. Gait normal.  Skin: Skin is warm, dry and intact.  Psychiatric: She has a normal mood and affect. Her speech is normal and behavior is normal. Judgment and thought content normal. Cognition and memory are normal.  Nursing note and vitals reviewed.   Assessment   1. Herniated disc, DDD, severe spinal stenosis with numbness b/l legs  2. DM 2 A1C 6.5  3. Leg edema stable  4. Vit D def  5. HM Plan   1. MRI reviewed  Refer to Dr. Lynann Bologna in [place]  2. Cont metformin  Do foot exam at f/u and disc eye exam, check labs and urine in 6 months Disc cinnamon and diet changes healthy reduce pepsi 3. Not tired lasix 10 mg qd prn  4. Weekly D3 then month 7 5000 IU daily  5.  Declines flu shot, Tdap Disc shingles, prevnar consider pna 23 given info  Consider twinrix vaccine +fatty liver  Referred mammogram and [facility] center appt today 4 and 4:30 pm  Records colonoscopy [facility] sign at f/u  Pap s/p hysterectomy no h/o abnormal pap last 6-7 years ago ovaries intact  Never smoker  Provider: Dr. [name] [facility]

## 2018-02-03 NOTE — Patient Instructions (Addendum)
Vitamin D3 month 7 5000 IU daily  Dr. Lynann Phillips in [facility] orthopedics   Diabetes Mellitus and Nutrition When you have diabetes (diabetes mellitus), it is very important to have healthy eating habits because your blood sugar (glucose) levels are greatly affected by what you eat and drink. Eating healthy foods in the appropriate amounts, at about the same times every day, can help you:  Control your blood glucose.  Lower your risk of heart disease.  Improve your blood pressure.  Reach or maintain a healthy weight.  Every person with diabetes is different, and each person has different needs for a meal plan. Your health care provider may recommend that you work with a diet and nutrition specialist (dietitian) to make a meal plan that is best for you. Your meal plan may vary depending on factors such as:  The calories you need.  The medicines you take.  Your weight.  Your blood glucose, blood pressure, and cholesterol levels.  Your activity level.  Other health conditions you have, such as heart or kidney disease.  How do carbohydrates affect me? Carbohydrates affect your blood glucose level more than any other type of food. Eating carbohydrates naturally increases the amount of glucose in your blood. Carbohydrate counting is a method for keeping track of how many carbohydrates you eat. Counting carbohydrates is important to keep your blood glucose at a healthy level, especially if you use insulin or take certain oral diabetes medicines. It is important to know how many carbohydrates you can safely have in each meal. This is different for every person. Your dietitian can help you calculate how many carbohydrates you should have at each meal and for snack. Foods that contain carbohydrates include:  Bread, cereal, rice, pasta, and crackers.  Potatoes and corn.  Peas, beans, and lentils.  Milk and yogurt.  Fruit and juice.  Desserts, such as cakes, cookies, ice cream,  and candy.  How does alcohol affect me? Alcohol can cause a sudden decrease in blood glucose (hypoglycemia), especially if you use insulin or take certain oral diabetes medicines. Hypoglycemia can be a life-threatening condition. Symptoms of hypoglycemia (sleepiness, dizziness, and confusion) are similar to symptoms of having too much alcohol. If your health care provider says that alcohol is safe for you, follow these guidelines:  Limit alcohol intake to no more than 1 drink per day for nonpregnant women and 2 drinks per day for men. One drink equals 12 oz of beer, 5 oz of wine, or 1 oz of hard liquor.  Do not drink on an empty stomach.  Keep yourself hydrated with water, diet soda, or unsweetened iced tea.  Keep in mind that regular soda, juice, and other mixers may contain a lot of sugar and must be counted as carbohydrates.  What are tips for following this plan? Reading food labels  Start by checking the serving size on the label. The amount of calories, carbohydrates, fats, and other nutrients listed on the label are based on one serving of the food. Many foods contain more than one serving per package.  Check the total grams (g) of carbohydrates in one serving. You can calculate the number of servings of carbohydrates in one serving by dividing the total carbohydrates by 15. For example, if a food has 30 g of total carbohydrates, it would be equal to 2 servings of carbohydrates.  Check the number of grams (g) of saturated and trans fats in one serving. Choose foods that have low or no amount of  these fats.  Check the number of milligrams (mg) of sodium in one serving. Most people should limit total sodium intake to less than 2,300 mg per day.  Always check the nutrition information of foods labeled as "low-fat" or "nonfat". These foods may be higher in added sugar or refined carbohydrates and should be avoided.  Talk to your dietitian to identify your daily goals for nutrients  listed on the label. Shopping  Avoid buying canned, premade, or processed foods. These foods tend to be high in fat, sodium, and added sugar.  Shop around the outside edge of the grocery store. This includes fresh fruits and vegetables, bulk grains, fresh meats, and fresh dairy. Cooking  Use low-heat cooking methods, such as baking, instead of high-heat cooking methods like deep frying.  Cook using healthy oils, such as olive, canola, or sunflower oil.  Avoid cooking with butter, cream, or high-fat meats. Meal planning  Eat meals and snacks regularly, preferably at the same times every day. Avoid going long periods of time without eating.  Eat foods high in fiber, such as fresh fruits, vegetables, beans, and whole grains. Talk to your dietitian about how many servings of carbohydrates you can eat at each meal.  Eat 4-6 ounces of lean protein each day, such as lean meat, chicken, fish, eggs, or tofu. 1 ounce is equal to 1 ounce of meat, chicken, or fish, 1 egg, or 1/4 cup of tofu.  Eat some foods each day that contain healthy fats, such as avocado, nuts, seeds, and fish. Lifestyle   Check your blood glucose regularly.  Exercise at least 30 minutes 5 or more days each week, or as told by your health care provider.  Take medicines as told by your health care provider.  Do not use any products that contain nicotine or tobacco, such as cigarettes and e-cigarettes. If you need help quitting, ask your health care provider.  Work with a Social worker or diabetes educator to identify strategies to manage stress and any emotional and social challenges. What are some questions to ask my health care provider?  Do I need to meet with a diabetes educator?  Do I need to meet with a dietitian?  What number can I call if I have questions?  When are the best times to check my blood glucose? Where to find more information:  American Diabetes Association:  diabetes.org/food-and-fitness/food  Academy of Nutrition and Dietetics: PokerClues.dk  [employer] of Diabetes and Digestive and Kidney Diseases (NIH): ContactWire.be Summary  A healthy meal plan will help you control your blood glucose and maintain a healthy lifestyle.  Working with a diet and nutrition specialist (dietitian) can help you make a meal plan that is best for you.  Keep in mind that carbohydrates and alcohol have immediate effects on your blood glucose levels. It is important to count carbohydrates and to use alcohol carefully. This information is not intended to replace advice given to you by your health care provider. Make sure you discuss any questions you have with your health care provider. Document Released: 11/21/2004 Document Revised: 03/31/2016 Document Reviewed: 03/31/2016 Elsevier Interactive Patient Education  2018 [place].  Herniated Disk A herniated disk is when a disk in your spine bulges out too far. There is a disk with a spongy center in between each pair of bones in the spine (vertebrae). These disks act as shock absorbers when you move. A herniated disk can cause pain and muscle weakness. This can happen anywhere in the back or  neck. Follow these instructions at home: Medicines  Take over-the-counter and prescription medicines only as told by your doctor.  Do not drive or use heavy machinery while taking prescription pain medicine. Activity  Rest as told by your doctor.  After your rest period: ? Return to your normal activities. Slowly start exercising as told by your doctor. Ask what activities are safe for you. ? Use good posture. ? Avoid movements that cause pain. ? Do not lift anything that is heavier than 10 lb (4.5 kg) until your doctor says this is safe. ? Do not sit or stand for a long time without  moving. ? Do not sit for a long time without getting up and moving around.  Do exercises (physical therapy) as told.  Try to strengthen your back and belly (abdomen) with exercises like crunches, swimming, or walking. General instructions  Do not use any products that contain nicotine or tobacco, such as cigarettes and e-cigarettes. If you need help quitting, ask your doctor.  Do not wear high-heeled shoes.  Do not sleep on your belly.  If you are overweight, work with your doctor to lose weight safely.  To prevent or treat constipation while you are taking prescription pain medicine, your doctor may recommend that you: ? Drink enough fluid to keep your pee (urine) clear or pale yellow. ? Take over-the-counter or prescription medicines. ? Eat foods that are high in fiber. These include fresh fruits and vegetables, whole grains, and beans. ? Limit foods that are high in fat and processed sugars. These include fried and sweet foods.  Keep all follow-up visits as told by your doctor. This is important. How is this prevented?  Stay at a healthy weight.  Try to avoid stress.  Stay in shape. Do at least 150 minutes of moderate-intensity exercise each week, such as fast walking or water aerobics.  When lifting objects: ? Keep your feet as far apart as your shoulders (shoulder-width apart) or farther apart. ? Tighten your belly muscles. ? Bend your knees and hips and keep your spine neutral. Lift using the strength of your legs, not your back. Do not lock your knees straight out. ? Always ask for help to lift heavy or awkward objects. Contact a doctor if:  You have back pain or neck pain that does not get better after 6 weeks.  You have very bad pain.  You get any of these problems in any part of your body: ? Tingling. ? Weakness. ? Loss of feeling (numbness). Get help right away if:  You cannot move your arms or legs.  You cannot control when you pee (urinate) or poop (have  a bowel movement).  You feel dizzy.  You faint.  You have trouble breathing. This information is not intended to replace advice given to you by your health care provider. Make sure you discuss any questions you have with your health care provider. Document Released: 07/11/2013 Document Revised: 10/24/2015 Document Reviewed: 08/23/2015 Elsevier Interactive Patient Education  2017 Elsevier Inc.  Fatty Liver Fatty liver, also called hepatic steatosis or steatohepatitis, is a condition in which too much fat has built up in your liver cells. The liver removes harmful substances from your bloodstream. It produces fluids your body needs. It also helps your body use and store energy from the food you eat. In many cases, fatty liver does not cause symptoms or problems. It is often diagnosed when tests are being done for other reasons. However, over time, fatty liver can  cause inflammation that may lead to more serious liver problems, such as scarring of the liver (cirrhosis). What are the causes? Causes of fatty liver may include:  Drinking too much alcohol.  Poor nutrition.  Obesity.  Cushing syndrome.  Diabetes.  Hyperlipidemia.  Pregnancy.  Certain drugs.  Poisons.  Some viral infections.  What increases the risk? You may be more likely to develop fatty liver if you:  Abuse alcohol.  Are pregnant.  Are overweight.  Have diabetes.  Have hepatitis.  Have a high triglyceride level.  What are the signs or symptoms? Fatty liver often does not cause any symptoms. In cases where symptoms develop, they can include:  Fatigue.  Weakness.  Weight loss.  Confusion.  Abdominal pain.  Yellowing of your skin and the white parts of your eyes (jaundice).  Nausea and vomiting.  How is this diagnosed? Fatty liver may be diagnosed by:  Physical exam and medical history.  Blood tests.  Imaging tests, such as an ultrasound, CT scan, or MRI.  Liver biopsy. A small  sample of liver tissue is removed using a needle. The sample is then looked at under a microscope.  How is this treated? Fatty liver is often caused by other health conditions. Treatment for fatty liver may involve medicines and lifestyle changes to manage conditions such as:  Alcoholism.  High cholesterol.  Diabetes.  Being overweight or obese.  Follow these instructions at home:  Eat a healthy diet as directed by your health care provider.  Exercise regularly. This can help you lose weight and control your cholesterol and diabetes. Talk to your health care provider about an exercise plan and which activities are best for you.  Do not drink alcohol.  Take medicines only as directed by your health care provider. Contact a health care provider if: You have difficulty controlling your:  Blood sugar.  Cholesterol.  Alcohol consumption.  Get help right away if:  You have abdominal pain.  You have jaundice.  You have nausea and vomiting. This information is not intended to replace advice given to you by your health care provider. Make sure you discuss any questions you have with your health care provider. Document Released: 04/11/2005 Document Revised: 08/02/2015 Document Reviewed: 07/06/2013 Elsevier Interactive Patient Education  2018 [employer].   Hepatitis A; Hepatitis B Vaccine injection-3 months over 6 months   What is this medicine? HEPATITIS A VACCINE; HEPATITIS B VACCINE (hep uh TAHY tis A vak SEEN; hep uh TAHY tis B vak SEEN) is a vaccine to protect from an infection with the hepatitis A and B virus. This vaccine does not contain the live viruses. It will not cause a hepatitis infection. This medicine may be used for other purposes; ask your health care provider or pharmacist if you have questions. COMMON BRAND NAME(S): Twinrix What should I tell my health care provider before I take this medicine? They need to know if you have any of these  conditions: -bleeding disorder -fever or infection -heart disease -immune system problems -an unusual or allergic reaction to hepatitis A or B vaccine, neomycin, yeast, thimerosal, other medicines, foods, dyes, or preservatives -pregnant or trying to get pregnant -breast-feeding How should I use this medicine? This vaccine is for injection into a muscle. It is given by a health care professional. A copy of Vaccine Information Statements will be given before each vaccination. Read this sheet carefully each time. The sheet may change frequently. Talk to your pediatrician regarding the use of this medicine  in children. Special care may be needed. Overdosage: If you think you have taken too much of this medicine contact a poison control center or emergency room at once. NOTE: This medicine is only for you. Do not share this medicine with others. What if I miss a dose? It is important not to miss your dose. Call your doctor or health care professional if you are unable to keep an appointment. What may interact with this medicine? -medicines that suppress your immune function like adalimumab, anakinra, infliximab -medicines to treat cancer -steroid medicines like prednisone or cortisone This list may not describe all possible interactions. Give your health care provider a list of all the medicines, herbs, non-prescription drugs, or dietary supplements you use. Also tell them if you smoke, drink alcohol, or use illegal drugs. Some items may interact with your medicine. What should I watch for while using this medicine? See your health care provider for all shots of this vaccine as directed. You must have 3 to 4 shots of this vaccine for protection from hepatitis A and B infection. Tell your doctor right away if you have any serious or unusual side effects after getting this vaccine. What side effects may I notice from receiving this medicine? Side effects that you should report to your doctor or  health care professional as soon as possible: -allergic reactions like skin rash, itching or hives, swelling of the face, lips, or tongue -breathing problems -confused, irritated -fast, irregular heartbeat -flu-like syndrome -numb, tingling pain -seizures Side effects that usually do not require medical attention (report to your doctor or health care professional if they continue or are bothersome): -diarrhea -fever -headache -loss of appetite -muscle pain -nausea -pain, redness, swelling, or irritation at site where injected -tiredness This list may not describe all possible side effects. Call your doctor for medical advice about side effects. You may report side effects to FDA at 1-800-FDA-1088. Where should I keep my medicine? This drug is given in a hospital or clinic and will not be stored at home. NOTE: This sheet is a summary. It may not cover all possible information. If you have questions about this medicine, talk to your doctor, pharmacist, or health care provider.  2018 Elsevier/Gold Standard (2007-07-09 15:21:37)   Spinal Stenosis Spinal stenosis occurs when the open space (spinal canal) between the bones of your spine (vertebrae) narrows, putting pressure on the spinal cord or nerves. What are the causes? This condition is caused by areas of bone pushing into the central canals of your vertebrae. This condition may be present at birth (congenital), or it may be caused by:  Arthritic deterioration of your vertebrae (spinal degeneration). This usually starts around age 2.  Injury or trauma to the spine.  Tumors in the spine.  Calcium deposits in the spine.  What are the signs or symptoms? Symptoms of this condition include:  Pain in the neck or back that is generally worse with activities, particularly when standing and walking.  Numbness, tingling, hot or cold sensations, weakness, or weariness in your legs.  Pain going up and down the leg  (sciatica).  Frequent episodes of falling.  A foot-slapping gait that leads to muscle weakness.  In more serious cases, you may develop:  Problemspassing stool or passing urine.  Difficulty having sex.  Loss of feeling in part or all of your leg.  Symptoms may come on slowly and get worse over time. How is this diagnosed? This condition is diagnosed based on your medical history and  a physical exam. Tests will also be done, such as:  MRI.  CT scan.  X-ray.  How is this treated? Treatment for this condition often focuses on managing your pain and any other symptoms. Treatment may include:  Practicing good posture to lessen pressure on your nerves.  Exercising to strengthen muscles, build endurance, improve balance, and maintain good joint movement (range of motion).  Losing weight, if needed.  Taking medicines to reduce swelling, inflammation, or pain.  Assistive devices, such as a corset or brace.  In some cases, surgery may be needed. The most common procedure is decompression laminectomy. This is done to remove excess bone that puts pressure on your nerve roots. Follow these instructions at home: Managing pain, stiffness, and swelling  Do all exercises and stretches as told by your health care provider.  Practice good posture. If you were given a brace or a corset, wear it as told by your health care provider.  Do not do any activities that cause pain. Ask your health care provider what activities are safe for you.  Do not lift anything that is heavier than 10 lb (4.5 kg) or the limit that your health care provider tells you.  Maintain a healthy weight. Talk with your health care provider if you need help losing weight.  If directed, apply heat to the affected area as often as told by your health care provider. Use the heat source that your health care provider recommends, such as a moist heat pack or a heating pad. ? Place a towel between your skin and the heat  source. ? Leave the heat on for 20-30 minutes. ? Remove the heat if your skin turns bright red. This is especially important if you are not able to feel pain, heat, or cold. You may have a greater risk of getting burned. General instructions  Take over-the-counter and prescription medicines only as told by your health care provider.  Do not use any products that contain nicotine or tobacco, such as cigarettes and e-cigarettes. If you need help quitting, ask your health care provider.  Eat a healthy diet. This includes plenty of fruits and vegetables, whole grains, and low-fat (lean) protein.  Keep all follow-up visits as told by your health care provider. This is important. Contact a health care provider if:  Your symptoms do not get better or they get worse.  You have a fever. Get help right away if:  You have new or worse pain in your neck or upper back.  You have severe pain that cannot be controlled with medicines.  You are dizzy.  You have vision problems, blurred vision, or double vision.  You have a severe headache that is worse when you stand.  You have nausea or you vomit.  You develop new or worse numbness or tingling in your back or legs.  You have pain, redness, swelling, or warmth in your arm or leg. Summary  Spinal stenosis occurs when the open space (spinal canal) between the bones of your spine (vertebrae) narrows. This narrowing puts pressure on the spinal cord or nerves.  Spinal stenosis can cause numbness, weakness, or pain in the neck, back, and legs.  This condition may be caused by a birth defect, arthritic deterioration of your vertebrae, injury, tumors, or calcium deposits.  This condition is usually diagnosed with MRIs, CT scans, and X-rays. This information is not intended to replace advice given to you by your health care provider. Make sure you discuss any questions you  have with your health care provider. Document Released: 05/17/2003 Document  Revised: 01/30/2016 Document Reviewed: 01/30/2016 Elsevier Interactive Patient Education  2018 [employer].   Back Pain, Adult Many adults have back pain from time to time. Common causes of back pain include:  A strained muscle or ligament.  Wear and tear (degeneration) of the spinal disks.  Arthritis.  A hit to the back.  Back pain can be short-lived (acute) or last a long time (chronic). A physical exam, lab tests, and imaging studies may be done to find the cause of your pain. Follow these instructions at home: Managing pain and stiffness  Take over-the-counter and prescription medicines only as told by your health care provider.  If directed, apply heat to the affected area as often as told by your health care provider. Use the heat source that your health care provider recommends, such as a moist heat pack or a heating pad. ? Place a towel between your skin and the heat source. ? Leave the heat on for 20-30 minutes. ? Remove the heat if your skin turns bright red. This is especially important if you are unable to feel pain, heat, or cold. You have a greater risk of getting burned.  If directed, apply ice to the injured area: ? Put ice in a plastic bag. ? Place a towel between your skin and the bag. ? Leave the ice on for 20 minutes, 2-3 times a day for the first 2-3 days. Activity  Do not stay in bed. Resting more than 1-2 days can delay your recovery.  Take short walks on even surfaces as soon as you are able. Try to increase the length of time you walk each day.  Do not sit, drive, or stand in one place for more than 30 minutes at a time. Sitting or standing for long periods of time can put stress on your back.  Use proper lifting techniques. When you bend and lift, use positions that put less stress on your back: ? [place] your knees. ? Keep the load close to your body. ? Avoid twisting.  Exercise regularly as told by your health care provider. Exercising will help  your back heal faster. This also helps prevent back injuries by keeping muscles strong and flexible.  Your health care provider may recommend that you see a physical therapist. This person can help you come up with a safe exercise program. Do any exercises as told by your physical therapist. Lifestyle  Maintain a healthy weight. Extra weight puts stress on your back and makes it difficult to have good posture.  Avoid activities or situations that make you feel anxious or stressed. Learn ways to manage anxiety and stress. One way to manage stress is through exercise. Stress and anxiety increase muscle tension and can make back pain worse. General instructions  Sleep on a firm mattress in a comfortable position. Try lying on your side with your knees slightly bent. If you lie on your back, put a pillow under your knees.  Follow your treatment plan as told by your health care provider. This may include: ? Cognitive or behavioral therapy. ? Acupuncture or massage therapy. ? Meditation or yoga. Contact a health care provider if:  You have pain that is not relieved with rest or medicine.  You have increasing pain going down into your legs or buttocks.  Your pain does not improve in 2 weeks.  You have pain at night.  You lose weight.  You have a fever  or chills. Get help right away if:  You develop new bowel or bladder control problems.  You have unusual weakness or numbness in your arms or legs.  You develop nausea or vomiting.  You develop abdominal pain.  You feel faint. Summary  Many adults have back pain from time to time. A physical exam, lab tests, and imaging studies may be done to find the cause of your pain.  Use proper lifting techniques. When you bend and lift, use positions that put less stress on your back.  Take over-the-counter and prescription medicines and apply heat or ice as directed by your health care provider. This information is not intended to replace  advice given to you by your health care provider. Make sure you discuss any questions you have with your health care provider. Document Released: 02/24/2005 Document Revised: 03/31/2016 Document Reviewed: 03/31/2016 Elsevier Interactive Patient Education  [employer].

## 2018-02-03 NOTE — Telephone Encounter (Signed)
Left message on voicemail for pt to return call to office for results.    

## 2018-02-03 NOTE — Progress Notes (Signed)
Pre visit review using our clinic review tool, if applicable. No additional management support is needed unless otherwise documented below in the visit note. 

## 2018-08-04 ENCOUNTER — Ambulatory Visit (INDEPENDENT_AMBULATORY_CARE_PROVIDER_SITE_OTHER): Payer: Medicare Other | Admitting: Internal Medicine

## 2018-08-04 ENCOUNTER — Other Ambulatory Visit: Payer: Self-pay

## 2018-08-04 DIAGNOSIS — M5442 Lumbago with sciatica, left side: Secondary | ICD-10-CM

## 2018-08-04 DIAGNOSIS — E119 Type 2 diabetes mellitus without complications: Secondary | ICD-10-CM

## 2018-08-04 DIAGNOSIS — R937 Abnormal findings on diagnostic imaging of other parts of musculoskeletal system: Secondary | ICD-10-CM | POA: Diagnosis not present

## 2018-08-04 DIAGNOSIS — E559 Vitamin D deficiency, unspecified: Secondary | ICD-10-CM | POA: Diagnosis not present

## 2018-08-04 DIAGNOSIS — M5441 Lumbago with sciatica, right side: Secondary | ICD-10-CM

## 2018-08-04 DIAGNOSIS — M5416 Radiculopathy, lumbar region: Secondary | ICD-10-CM

## 2018-08-04 DIAGNOSIS — R600 Localized edema: Secondary | ICD-10-CM

## 2018-08-04 DIAGNOSIS — Z1329 Encounter for screening for other suspected endocrine disorder: Secondary | ICD-10-CM

## 2018-08-04 DIAGNOSIS — G8929 Other chronic pain: Secondary | ICD-10-CM

## 2018-08-04 NOTE — Patient Instructions (Signed)
Alpha lipoic acid supplement for numbness and tingling call me back in 1 month  Vitamin D3 5000 IU daily after you stop the weekly supplement   Consider gabapentin or cymbalta if the alpha lipoic acid does not help   Labs 12/27/2018 at [facility] in [place]   Duloxetine delayed-release capsules What is this medicine? DULOXETINE (doo LOX e teen) is used to treat depression, anxiety, and different types of chronic pain. This medicine may be used for other purposes; ask your health care provider or pharmacist if you have questions. COMMON BRAND NAME(S): Cymbalta, [place] What should I tell my health care provider before I take this medicine? They need to know if you have any of these conditions: -bipolar disorder or a family history of bipolar disorder -glaucoma -kidney disease -liver disease -suicidal thoughts or a previous suicide attempt -taken medicines called MAOIs like Carbex, Eldepryl, Marplan, Nardil, and Parnate within 14 days -an unusual reaction to duloxetine, other medicines, foods, dyes, or preservatives -pregnant or trying to get pregnant -breast-feeding How should I use this medicine? Take this medicine by mouth with a glass of water. Follow the directions on the prescription label. Do not cut, crush or chew this medicine. You can take this medicine with or without food. Take your medicine at regular intervals. Do not take your medicine more often than directed. Do not stop taking this medicine suddenly except upon the advice of your doctor. Stopping this medicine too quickly may cause serious side effects or your condition may worsen. A special MedGuide will be given to you by the pharmacist with each prescription and refill. Be sure to read this information carefully each time. Talk to your pediatrician regarding the use of this medicine in children. While this drug may be prescribed for children as young as 57 years of age for selected conditions, precautions do  apply. Overdosage: If you think you have taken too much of this medicine contact a poison control center or emergency room at once. NOTE: This medicine is only for you. Do not share this medicine with others. What if I miss a dose? If you miss a dose, take it as soon as you can. If it is almost time for your next dose, take only that dose. Do not take double or extra doses. What may interact with this medicine? Do not take this medicine with any of the following medications: -desvenlafaxine -levomilnacipran -linezolid -MAOIs like Carbex, Eldepryl, Marplan, Nardil, and Parnate -methylene blue (injected into a vein) -milnacipran -thioridazine -venlafaxine This medicine may also interact with the following medications: -alcohol -amphetamines -aspirin and aspirin-like medicines -certain antibiotics like ciprofloxacin and enoxacin -certain medicines for blood pressure, heart disease, irregular heart beat -certain medicines for depression, anxiety, or psychotic disturbances -certain medicines for migraine headache like almotriptan, eletriptan, frovatriptan, naratriptan, rizatriptan, sumatriptan, zolmitriptan -certain medicines that treat or prevent blood clots like warfarin, enoxaparin, and dalteparin -cimetidine -fentanyl -lithium -NSAIDS, medicines for pain and inflammation, like ibuprofen or naproxen -phentermine -procarbazine -rasagiline -sibutramine -St. John's wort -theophylline -tramadol -tryptophan This list may not describe all possible interactions. Give your health care provider a list of all the medicines, herbs, non-prescription drugs, or dietary supplements you use. Also tell them if you smoke, drink alcohol, or use illegal drugs. Some items may interact with your medicine. What should I watch for while using this medicine? Tell your doctor if your symptoms do not get better or if they get worse. Visit your doctor or health care professional for regular checks on your  progress. Because it may take several weeks to see the full effects of this medicine, it is important to continue your treatment as prescribed by your doctor. Patients and their families should watch out for new or worsening thoughts of suicide or depression. Also watch out for sudden changes in feelings such as feeling anxious, agitated, panicky, irritable, hostile, aggressive, impulsive, severely restless, overly excited and hyperactive, or not being able to sleep. If this happens, especially at the beginning of treatment or after a change in dose, call your health care professional. Dennis Bast may get drowsy or dizzy. Do not drive, use machinery, or do anything that needs mental alertness until you know how this medicine affects you. Do not stand or sit up quickly, especially if you are an older patient. This reduces the risk of dizzy or fainting spells. Alcohol may interfere with the effect of this medicine. Avoid alcoholic drinks. This medicine can cause an increase in blood pressure. This medicine can also cause a sudden drop in your blood pressure, which may make you feel faint and increase the chance of a fall. These effects are most common when you first start the medicine or when the dose is increased, or during use of other medicines that can cause a sudden drop in blood pressure. Check with your doctor for instructions on monitoring your blood pressure while taking this medicine. Your mouth may get dry. Chewing sugarless gum or sucking hard candy, and drinking plenty of water may help. Contact your doctor if the problem does not go away or is severe. What side effects may I notice from receiving this medicine? Side effects that you should report to your doctor or health care professional as soon as possible: -allergic reactions like skin rash, itching or hives, swelling of the face, lips, or tongue -anxious -breathing problems -confusion -changes in vision -chest pain -confusion -elevated mood,  decreased need for sleep, racing thoughts, impulsive behavior -eye pain -fast, irregular heartbeat -feeling faint or lightheaded, falls -feeling agitated, angry, or irritable -hallucination, loss of contact with reality -high blood pressure -loss of balance or coordination -palpitations -redness, blistering, peeling or loosening of the skin, including inside the mouth -restlessness, pacing, inability to keep still -seizures -stiff muscles -suicidal thoughts or other mood changes -trouble passing urine or change in the amount of urine -trouble sleeping -unusual bleeding or bruising -unusually weak or tired -vomiting -yellowing of the eyes or skin Side effects that usually do not require medical attention (report to your doctor or health care professional if they continue or are bothersome): -change in sex drive or performance -change in appetite or weight -constipation -dizziness -dry mouth -headache -increased sweating -nausea -tired This list may not describe all possible side effects. Call your doctor for medical advice about side effects. You may report side effects to FDA at 1-800-FDA-1088. Where should I keep my medicine? Keep out of the reach of children. Store at room temperature between 20 and 25 degrees C (68 to 77 degrees F). Throw away any unused medicine after the expiration date. NOTE: This sheet is a summary. It may not cover all possible information. If you have questions about this medicine, talk to your doctor, pharmacist, or health care provider.  2019 Elsevier/Gold Standard (2015-07-26 18:16:03)  Gabapentin capsules or tablets What is this medicine? GABAPENTIN (GA ba pen tin) is used to control seizures in certain types of epilepsy. It is also used to treat certain types of nerve pain. This medicine may be used for other purposes; ask  your health care provider or pharmacist if you have questions. COMMON BRAND NAME(S): Active-PAC with Gabapentin, Gabarone,  Neurontin What should I tell my health care provider before I take this medicine? They need to know if you have any of these conditions: -kidney disease -suicidal thoughts, plans, or attempt; a previous suicide attempt by you or a family member -an unusual or allergic reaction to gabapentin, other medicines, foods, dyes, or preservatives -pregnant or trying to get pregnant -breast-feeding How should I use this medicine? Take this medicine by mouth with a glass of water. Follow the directions on the prescription label. You can take it with or without food. If it upsets your stomach, take it with food. Take your medicine at regular intervals. Do not take it more often than directed. Do not stop taking except on your doctor's advice. If you are directed to break the 600 or 800 mg tablets in half as part of your dose, the extra half tablet should be used for the next dose. If you have not used the extra half tablet within 28 days, it should be thrown away. A special MedGuide will be given to you by the pharmacist with each prescription and refill. Be sure to read this information carefully each time. Talk to your pediatrician regarding the use of this medicine in children. While this drug may be prescribed for children as young as 3 years for selected conditions, precautions do apply. Overdosage: If you think you have taken too much of this medicine contact a poison control center or emergency room at once. NOTE: This medicine is only for you. Do not share this medicine with others. What if I miss a dose? If you miss a dose, take it as soon as you can. If it is almost time for your next dose, take only that dose. Do not take double or extra doses. What may interact with this medicine? Do not take this medicine with any of the following medications: -other gabapentin products This medicine may also interact with the following medications: -alcohol -antacids -antihistamines for allergy, cough and  cold -certain medicines for anxiety or sleep -certain medicines for depression or psychotic disturbances -homatropine; hydrocodone -naproxen -narcotic medicines (opiates) for pain -phenothiazines like chlorpromazine, mesoridazine, prochlorperazine, thioridazine This list may not describe all possible interactions. Give your health care provider a list of all the medicines, herbs, non-prescription drugs, or dietary supplements you use. Also tell them if you smoke, drink alcohol, or use illegal drugs. Some items may interact with your medicine. What should I watch for while using this medicine? Visit your doctor or health care professional for regular checks on your progress. You may want to keep a record at home of how you feel your condition is responding to treatment. You may want to share this information with your doctor or health care professional at each visit. You should contact your doctor or health care professional if your seizures get worse or if you have any new types of seizures. Do not stop taking this medicine or any of your seizure medicines unless instructed by your doctor or health care professional. Stopping your medicine suddenly can increase your seizures or their severity. Wear a medical identification bracelet or chain if you are taking this medicine for seizures, and carry a card that lists all your medications. You may get drowsy, dizzy, or have blurred vision. Do not drive, use machinery, or do anything that needs mental alertness until you know how this medicine affects you. To reduce dizzy  or fainting spells, do not sit or stand up quickly, especially if you are an older patient. Alcohol can increase drowsiness and dizziness. Avoid alcoholic drinks. Your mouth may get dry. Chewing sugarless gum or sucking hard candy, and drinking plenty of water will help. The use of this medicine may increase the chance of suicidal thoughts or actions. Pay special attention to how you are  responding while on this medicine. Any worsening of mood, or thoughts of suicide or dying should be reported to your health care professional right away. Women who become pregnant while using this medicine may enroll in the [place] Pregnancy Registry by calling [phone]. This registry collects information about the safety of antiepileptic drug use during pregnancy. What side effects may I notice from receiving this medicine? Side effects that you should report to your doctor or health care professional as soon as possible: -allergic reactions like skin rash, itching or hives, swelling of the face, lips, or tongue -worsening of mood, thoughts or actions of suicide or dying Side effects that usually do not require medical attention (report to your doctor or health care professional if they continue or are bothersome): -constipation -difficulty walking or controlling muscle movements -dizziness -nausea -slurred speech -tiredness -tremors -weight gain This list may not describe all possible side effects. Call your doctor for medical advice about side effects. You may report side effects to FDA at 1-800-FDA-1088. Where should I keep my medicine? Keep out of reach of children. This medicine may cause accidental overdose and death if it taken by other adults, children, or pets. Mix any unused medicine with a substance like cat litter or coffee grounds. Then throw the medicine away in a sealed container like a sealed bag or a coffee can with a lid. Do not use the medicine after the expiration date. Store at room temperature between 15 and 30 degrees C (59 and 86 degrees F). NOTE: This sheet is a summary. It may not cover all possible information. If you have questions about this medicine, talk to your doctor, pharmacist, or health care provider.  2019 Elsevier/Gold Standard (2017-07-30 13:21:44)

## 2018-08-04 NOTE — Progress Notes (Signed)
Telephone Note  I connected with Paige Phillips  on 08/04/18 at  8:45 AM EDT by a telephone and verified that I am speaking with the correct person using two identifiers.  Location patient: car Location provider:work  Persons participating in the virtual visit: patient, provider  I discussed the limitations of evaluation and management by telemedicine and the availability of in person appointments. The patient expressed understanding and agreed to proceed.   HPI: 1. DM 2 A1C 6.5 on metformin she reports appetite is less and eating 1-2 meals instead of 3  weight is stable. She is not as hungry  2. Abnormal lumbar MRI with DDD and spinal stenosis with numbness and tingling in feet with [facility] ortho had 1x steroid injection which helps and wears her back brace  She is going to call to get another  3. Leg swelling is better   ROS: See pertinent positives and negatives per HPI. GI: denies dysphagia or trouble swallowing  Past Medical History:  Diagnosis Date  . Chickenpox   . DDD (degenerative disc disease), lumbar   . GERD (gastroesophageal reflux disease)   . Lumbar herniated disc   . Spinal stenosis     Past Surgical History:  Procedure Laterality Date  . ABDOMINAL HYSTERECTOMY  1983   fibroids     Family History  Problem Relation Age of Onset  . Cancer Father        pancreatic cancer   . Cancer Sister        breast cancer age 21s  . Diabetes Sister   . Cancer Brother        pancreatic cancer  . Stroke Brother        died stroke  . ALS Mother        46s    SOCIAL HX: works part time Engineer, materials    Current Outpatient Medications:  .  Cholecalciferol 50000 units capsule, Take 1 capsule (50,000 Units total) by mouth once a week., Disp: 13 capsule, Rfl: 1 .  furosemide (LASIX) 20 MG tablet, Take 0.5 tablets (10 mg total) by mouth daily as needed., Disp: 30 tablet, Rfl: 2 .  metFORMIN (GLUCOPHAGE) 500 MG tablet, Take 1 tablet (500 mg total) by mouth daily with  breakfast. [name], Disp: 90 tablet, Rfl: 3 .  naproxen sodium (ALEVE) 220 MG tablet, Take 220 mg by mouth., Disp: , Rfl:   EXAM:  VITALS per patient if applicable:  GENERAL: alert, oriented, appears well and in no acute distress  PSYCH/NEURO: pleasant and cooperative, no obvious depression or anxiety, speech and thought processing grossly intact  ASSESSMENT AND PLAN:  Discussed the following assessment and plan:  Type 2 diabetes mellitus without complication, without long-term current use of insulin ([place]) - Plan: Comprehensive metabolic panel, CBC w/Diff, Lipid panel, Hemoglobin A1c, Urinalysis, Routine w reflex microscopic, Microalbumin / creatinine urine ratio  Bilateral leg edema - Plan: Comprehensive metabolic panel, CBC w/Diff, prn lasix   MRI abnormal with DDD and severe spinal stenosis with neuropathy  -disc alpha lipoic acid and if not better call back in 1 month will consider cymbalta or gabapentin -she will call Dr. Mina Marble for 2nd steroid injection   Vitamin D deficiency - Plan: Vitamin D (25 hydroxy)  HM -want to repeat labs 12/27/2018 [facility] in [place]   Declines flu shot, Tdap Disc shingles, prevnar consider pna 23 given info  Consider twinrix vaccine +fatty liver  Mammogram 02/03/18 neg  DEXA 02/03/18 negative  Records colonoscopy [facility] sign at f/u  Pap s/p hysterectomy no h/o abnormal pap last 6-7 years ago ovaries intact  Never smoker   I discussed the assessment and treatment plan with the patient. The patient was provided an opportunity to ask questions and all were answered. The patient agreed with the plan and demonstrated an understanding of the instructions.   The patient was advised to call back or seek an in-person evaluation if the symptoms worsen or if the condition fails to improve as anticipated.  Time spent 15 minutes  Delorise Jackson, MD

## 2018-12-21 ENCOUNTER — Other Ambulatory Visit: Payer: Self-pay | Admitting: Internal Medicine

## 2018-12-21 DIAGNOSIS — R609 Edema, unspecified: Secondary | ICD-10-CM

## 2018-12-21 DIAGNOSIS — E119 Type 2 diabetes mellitus without complications: Secondary | ICD-10-CM

## 2018-12-21 MED ORDER — METFORMIN HCL 500 MG PO TABS: 500.0000 mg | ORAL_TABLET | Freq: Every day | ORAL | 3 refills | Status: DC

## 2018-12-21 MED ORDER — FUROSEMIDE 20 MG PO TABS: 10.0000 mg | ORAL_TABLET | Freq: Every day | ORAL | 2 refills | Status: DC | PRN

## 2019-02-07 ENCOUNTER — Telehealth: Payer: Self-pay | Admitting: Internal Medicine

## 2019-02-07 NOTE — Telephone Encounter (Signed)
Pt resch appt to 12/11.

## 2019-02-07 NOTE — Telephone Encounter (Signed)
Please call pt has not done fasting labs at [facility]  and urine orders placed 07/2018 but should not be expired   -she can go to [facility] in [place] I believe they should be able to see the orders if not mail to patient    Also if A1C still diabetic range would she be willing to try statin which is part of diabetes protocol to be on statin cholesterol medication ?  [place]

## 2019-02-07 NOTE — Telephone Encounter (Signed)
Paige Phillips carries a diagnosis of diabetes and is not currently prescribed a statin.   10-year ASCVD risk is 17.1 %  Last LDL: 75 mg/dL   My review of her chart shows no previous trials of a statin and no history of intolerance. Noted the patient has a history of fatty liver and elevated LFTs.    Recommendation: Please consider obtaining a lipid panel and CMP and assessing ASCVD risk for prescribing a moderate intensity statin.   Medication name Strength Quantity Directions Refills  ATORVASTATIN 10 MG 90 ONCE DAILY 3  ROSUVASTATIN 5 MG 90 ONCE DAILY 3  SIMVASTATIN 20MG  90 ONCE DAILY 3  PRAVASTATIN 40MG  90 ONCE DAILY 3    If intolerance is a concern, could consider a once, twice or three times weekly regimen.  Directions Quantity Refills  ONCE WEEKLY 13 3  TWICE WEEKLY 26 3  THREE TIMES WEEKLY 39 3    Kind Regards,  Bethany D. Shara Blazing, [place]  Work Cell: [phone]

## 2019-02-08 ENCOUNTER — Ambulatory Visit: Payer: Medicare Other | Admitting: Internal Medicine

## 2019-02-11 DIAGNOSIS — Z1329 Encounter for screening for other suspected endocrine disorder: Secondary | ICD-10-CM | POA: Diagnosis not present

## 2019-02-11 DIAGNOSIS — E559 Vitamin D deficiency, unspecified: Secondary | ICD-10-CM | POA: Diagnosis not present

## 2019-02-11 DIAGNOSIS — E119 Type 2 diabetes mellitus without complications: Secondary | ICD-10-CM | POA: Diagnosis not present

## 2019-02-11 DIAGNOSIS — R600 Localized edema: Secondary | ICD-10-CM | POA: Diagnosis not present

## 2019-02-12 LAB — URINALYSIS, ROUTINE W REFLEX MICROSCOPIC
Bilirubin, UA: NEGATIVE
Glucose, UA: NEGATIVE
Ketones, UA: NEGATIVE
Leukocytes,UA: NEGATIVE
Nitrite, UA: NEGATIVE
Protein,UA: NEGATIVE
RBC, UA: NEGATIVE
Specific Gravity, UA: 1.025 (ref 1.005–1.030)
Urobilinogen, Ur: 0.2 mg/dL (ref 0.2–1.0)
pH, UA: 5.5 (ref 5.0–7.5)

## 2019-02-12 LAB — CBC WITH DIFFERENTIAL/PLATELET
Basophils Absolute: 0 10*3/uL (ref 0.0–0.2)
Basos: 1 %
EOS (ABSOLUTE): 0.2 10*3/uL (ref 0.0–0.4)
Eos: 2 %
Hematocrit: 39.1 % (ref 34.0–46.6)
Hemoglobin: 12.8 g/dL (ref 11.1–15.9)
Immature Grans (Abs): 0 10*3/uL (ref 0.0–0.1)
Immature Granulocytes: 0 %
Lymphocytes Absolute: 1.9 10*3/uL (ref 0.7–3.1)
Lymphs: 26 %
MCH: 28.1 pg (ref 26.6–33.0)
MCHC: 32.7 g/dL (ref 31.5–35.7)
MCV: 86 fL (ref 79–97)
Monocytes Absolute: 0.5 10*3/uL (ref 0.1–0.9)
Monocytes: 7 %
Neutrophils Absolute: 4.7 10*3/uL (ref 1.4–7.0)
Neutrophils: 64 %
Platelets: 275 10*3/uL (ref 150–450)
RBC: 4.55 x10E6/uL (ref 3.77–5.28)
RDW: 12.8 % (ref 11.7–15.4)
WBC: 7.3 10*3/uL (ref 3.4–10.8)

## 2019-02-12 LAB — COMPREHENSIVE METABOLIC PANEL
ALT: 12 IU/L (ref 0–32)
AST: 16 IU/L (ref 0–40)
Albumin/Globulin Ratio: 1.5 (ref 1.2–2.2)
Albumin: 4.5 g/dL (ref 3.8–4.8)
Alkaline Phosphatase: 116 IU/L (ref 39–117)
BUN/Creatinine Ratio: 17 (ref 12–28)
BUN: 15 mg/dL (ref 8–27)
Bilirubin Total: 0.5 mg/dL (ref 0.0–1.2)
CO2: 23 mmol/L (ref 20–29)
Calcium: 9.5 mg/dL (ref 8.7–10.3)
Chloride: 102 mmol/L (ref 96–106)
Creatinine, Ser: 0.87 mg/dL (ref 0.57–1.00)
GFR calc Af Amer: 80 mL/min/{1.73_m2} (ref >59)
GFR calc non Af Amer: 69 mL/min/{1.73_m2} (ref >59)
Globulin, Total: 3 g/dL (ref 1.5–4.5)
Glucose: 139 mg/dL — ABNORMAL HIGH (ref 65–99)
Potassium: 4.5 mmol/L (ref 3.5–5.2)
Sodium: 141 mmol/L (ref 134–144)
Total Protein: 7.5 g/dL (ref 6.0–8.5)

## 2019-02-12 LAB — TSH: TSH: 1.65 u[IU]/mL (ref 0.450–4.500)

## 2019-02-12 LAB — LIPID PANEL
Chol/HDL Ratio: 3.4 ratio (ref 0.0–4.4)
Cholesterol, Total: 142 mg/dL (ref 100–199)
HDL: 42 mg/dL (ref >39)
LDL Chol Calc (NIH): 86 mg/dL (ref 0–99)
Triglycerides: 67 mg/dL (ref 0–149)
VLDL Cholesterol Cal: 14 mg/dL (ref 5–40)

## 2019-02-12 LAB — VITAMIN D 25 HYDROXY (VIT D DEFICIENCY, FRACTURES): Vit D, 25-Hydroxy: 33.9 ng/mL (ref 30.0–100.0)

## 2019-02-12 LAB — MICROALBUMIN / CREATININE URINE RATIO
Creatinine, Urine: 167.6 mg/dL
Microalb/Creat Ratio: 10 mg/g creat (ref 0–29)
Microalbumin, Urine: 16.7 ug/mL

## 2019-02-12 LAB — HEMOGLOBIN A1C
Est. average glucose Bld gHb Est-mCnc: 131 mg/dL
Hgb A1c MFr Bld: 6.2 % — ABNORMAL HIGH (ref 4.8–5.6)

## 2019-02-14 ENCOUNTER — Other Ambulatory Visit: Payer: Self-pay | Admitting: Internal Medicine

## 2019-02-14 DIAGNOSIS — E119 Type 2 diabetes mellitus without complications: Secondary | ICD-10-CM

## 2019-02-14 MED ORDER — PRAVASTATIN SODIUM 20 MG PO TABS: 20.0000 mg | ORAL_TABLET | Freq: Every day | ORAL | 3 refills | Status: DC

## 2019-02-18 ENCOUNTER — Ambulatory Visit (INDEPENDENT_AMBULATORY_CARE_PROVIDER_SITE_OTHER): Payer: Medicare Other | Admitting: Internal Medicine

## 2019-02-18 ENCOUNTER — Encounter: Payer: Self-pay | Admitting: Internal Medicine

## 2019-02-18 VITALS — Ht 64.0 in | Wt 220.0 lb

## 2019-02-18 DIAGNOSIS — R9389 Abnormal findings on diagnostic imaging of other specified body structures: Secondary | ICD-10-CM | POA: Diagnosis not present

## 2019-02-18 DIAGNOSIS — M5416 Radiculopathy, lumbar region: Secondary | ICD-10-CM

## 2019-02-18 DIAGNOSIS — E119 Type 2 diabetes mellitus without complications: Secondary | ICD-10-CM

## 2019-02-18 DIAGNOSIS — Z1231 Encounter for screening mammogram for malignant neoplasm of breast: Secondary | ICD-10-CM | POA: Diagnosis not present

## 2019-02-18 DIAGNOSIS — Z1211 Encounter for screening for malignant neoplasm of colon: Secondary | ICD-10-CM | POA: Diagnosis not present

## 2019-02-18 NOTE — Patient Instructions (Addendum)
Budget-Friendly Healthy Eating There are many ways to save money at the grocery store and continue to eat healthy. You can be successful if you:  Plan meals according to your budget.  Make a grocery list and only purchase food according to your grocery list.  Prepare food yourself. What are tips for following this plan?  Reading food labels  Compare food labels between brand name foods and the store brand. Often the nutritional value is the same, but the store brand is lower cost.  Look for products that do not have added sugar, fat, or salt (sodium). These often cost the same but are healthier for you. Products may be labeled as: ? Sugar-free. ? Nonfat. ? Low-fat. ? Sodium-free. ? Low-sodium.  Look for lean ground beef labeled as at least 92% lean and 8% fat. Shopping  Buy only the items on your grocery list and go only to the areas of the store that have the items on your list.  Use coupons only for foods and brands you normally buy. Avoid buying items you wouldn't normally buy simply because they are on sale.  Check online and in newspapers for weekly deals.  Buy healthy items from the bulk bins when available, such as herbs, spices, flour, pasta, nuts, and dried fruit.  Buy fruits and vegetables that are in season. Prices are usually lower on in-season produce.  Look at the unit price on the price tag. Use it to compare different brands and sizes to find out which item is the best deal.  Choose healthy items that are often low-cost, such as carrots, potatoes, apples, bananas, and oranges. Dried or canned beans are a low-cost protein source.  Buy in bulk and freeze extra food. Items you can buy in bulk include meats, fish, poultry, frozen fruits, and frozen vegetables.  Avoid buying "ready-to-eat" foods, such as pre-cut fruits and vegetables and pre-made salads.  If possible, shop around to discover where you can find the best prices. Consider other retailers such as  dollar stores, larger [employer], local fruit and vegetable stands, and farmers markets.  Do not shop when you are hungry. If you shop while hungry, it may be hard to stick to your list and budget.  Resist impulse buying. Use your grocery list as your official plan for the week.  Buy a variety of vegetables and fruits by purchasing fresh, frozen, and canned items.  Look at the top and bottom shelves for deals. Foods at eye level (eye level of an adult or child) are usually more expensive.  Be efficient with your time when shopping. The more time you spend at the store, the more money you are likely to spend.  To save money when choosing more expensive foods like meats and dairy: ? Choose cheaper cuts of meat, such as bone-in chicken thighs and drumsticks instead of skinless and boneless chicken. When you are ready to prepare the chicken, you can remove the skin yourself to make it healthier. ? Choose lean meats like chicken or [place] instead of beef. ? Choose canned seafood, such as tuna, salmon, or sardines. ? Buy eggs as a low-cost source of protein. ? Buy dried beans and peas, such as lentils, split peas, or kidney beans instead of meats. Dried beans and peas are a good alternative source of protein. ? Buy the larger tubs of yogurt instead of individual-sized containers.  Choose water instead of sodas and other sweetened beverages.  Avoid buying chips, cookies, and other "junk food." These  items are usually expensive and not healthy. Cooking  Make extra food and freeze the extras in meal-sized containers or in individual portions for fast meals and snacks.  Pre-cook on days when you have extra time to prepare meals in advance. You can keep these meals in the fridge or freezer and reheat for a quick meal.  When you come home from the grocery store, wash, peel, and cut fruits and vegetables so they are ready to use and eat. This will help reduce food waste. Meal planning  Do  not eat out or get fast food. Prepare food at home.  Make a grocery list and make sure to bring it with you to the store. If you have a smart phone, you could use your phone to create your shopping list.  Plan meals and snacks according to a grocery list and budget you create.  Use leftovers in your meal plan for the week.  Look for recipes where you can cook once and make enough food for two meals.  Include budget-friendly meals like stews, casseroles, and stir-fry dishes.  Try some meatless meals or try "no cook" meals like salads.  Make sure that half your plate is filled with fruits or vegetables. Choose from fresh, frozen, or canned fruits and vegetables. If eating canned, remember to rinse them before eating. This will remove any excess salt added for packaging. Summary  Eating healthy on a budget is possible if you plan your meals according to your budget, purchase according to your budget and grocery list, and prepare food yourself.  Tips for buying more food on a limited budget include buying generic brands, using coupons only for foods you normally buy, and buying healthy items from the bulk bins when available.  Tips for buying cheaper food to replace expensive food include choosing cheaper, lean cuts of meat, and buying dried beans and peas. This information is not intended to replace advice given to you by your health care provider. Make sure you discuss any questions you have with your health care provider. Document Released: 10/28/2013 Document Revised: 02/25/2017 Document Reviewed: 02/25/2017 Elsevier Patient Education  2020 [employer] Following a healthy eating pattern may help you to achieve and maintain a healthy body weight, reduce the risk of chronic disease, and live a long and productive life. It is important to follow a healthy eating pattern at an appropriate calorie level for your body. Your nutritional needs should be met primarily through  food by choosing a variety of nutrient-rich foods. What are tips for following this plan? Reading food labels  Read labels and choose the following: ? Reduced or low sodium. ? Juices with 100% fruit juice. ? Foods with low saturated fats and high polyunsaturated and monounsaturated fats. ? Foods with whole grains, such as whole wheat, cracked wheat, brown rice, and wild rice. ? Whole grains that are fortified with folic acid. This is recommended for women who are pregnant or who want to become pregnant.  Read labels and avoid the following: ? Foods with a lot of added sugars. These include foods that contain brown sugar, corn sweetener, corn syrup, dextrose, fructose, glucose, high-fructose corn syrup, honey, invert sugar, lactose, malt syrup, maltose, molasses, raw sugar, sucrose, trehalose, or turbinado sugar.  Do not eat more than the following amounts of added sugar per day:  6 teaspoons (25 g) for women.  9 teaspoons (38 g) for men. ? Foods that contain processed or refined starches and grains. ? Refined  grain products, such as white flour, degermed cornmeal, white bread, and white rice. Shopping  Choose nutrient-rich snacks, such as vegetables, whole fruits, and nuts. Avoid high-calorie and high-sugar snacks, such as potato chips, fruit snacks, and candy.  Use oil-based dressings and spreads on foods instead of solid fats such as butter, stick margarine, or cream cheese.  Limit pre-made sauces, mixes, and "instant" products such as flavored rice, instant noodles, and ready-made pasta.  Try more plant-protein sources, such as tofu, tempeh, black beans, edamame, lentils, nuts, and seeds.  Explore eating plans such as the Mediterranean diet or vegetarian diet. Cooking  Use oil to saut or stir-fry foods instead of solid fats such as butter, stick margarine, or lard.  Try baking, boiling, grilling, or broiling instead of frying.  Remove the fatty part of meats before  cooking.  Steam vegetables in water or broth. Meal planning   At meals, imagine dividing your plate into fourths: ? One-half of your plate is fruits and vegetables. ? One-fourth of your plate is whole grains. ? One-fourth of your plate is protein, especially lean meats, poultry, eggs, tofu, beans, or nuts.  Include low-fat dairy as part of your daily diet. Lifestyle  Choose healthy options in all settings, including home, work, school, restaurants, or stores.  Prepare your food safely: ? Wash your hands after handling raw meats. ? Keep food preparation surfaces clean by regularly washing with hot, soapy water. ? Keep raw meats separate from ready-to-eat foods, such as fruits and vegetables. ? Cook seafood, meat, poultry, and eggs to the recommended internal temperature. ? Store foods at safe temperatures. In general:  Keep cold foods at 67F (4.4C) or below.  Keep hot foods at 167F (60C) or above.  Keep your freezer at [facility] (-17.8C) or below.  Foods are no longer safe to eat when they have been between the temperatures of 40-167F (4.4-60C) for more than 2 hours. What foods should I eat? Fruits Aim to eat 2 cup-equivalents of fresh, canned (in natural juice), or frozen fruits each day. Examples of 1 cup-equivalent of fruit include 1 small apple, 8 large strawberries, 1 cup canned fruit,  cup dried fruit, or 1 cup 100% juice. Vegetables Aim to eat 2-3 cup-equivalents of fresh and frozen vegetables each day, including different varieties and colors. Examples of 1 cup-equivalent of vegetables include 2 medium carrots, 2 cups raw, leafy greens, 1 cup chopped vegetable (raw or cooked), or 1 medium baked potato. Grains Aim to eat 6 ounce-equivalents of whole grains each day. Examples of 1 ounce-equivalent of grains include 1 slice of bread, 1 cup ready-to-eat cereal, 3 cups popcorn, or  cup cooked rice, pasta, or cereal. Meats and other proteins Aim to eat 5-6  ounce-equivalents of protein each day. Examples of 1 ounce-equivalent of protein include 1 egg, 1/2 cup nuts or seeds, or 1 tablespoon (16 g) peanut butter. A cut of meat or fish that is the size of a deck of cards is about 3-4 ounce-equivalents.  Of the protein you eat each week, try to have at least 8 ounces come from seafood. This includes salmon, trout, herring, and anchovies. Dairy Aim to eat 3 cup-equivalents of fat-free or low-fat dairy each day. Examples of 1 cup-equivalent of dairy include 1 cup (240 mL) milk, 8 ounces (250 g) yogurt, 1 ounces (44 g) natural cheese, or 1 cup (240 mL) fortified soy milk. Fats and oils  Aim for about 5 teaspoons (21 g) per day. Choose monounsaturated fats, such as  canola and olive oils, avocados, peanut butter, and most nuts, or polyunsaturated fats, such as sunflower, corn, and soybean oils, walnuts, pine nuts, sesame seeds, sunflower seeds, and flaxseed. Beverages  Aim for six 8-oz glasses of water per day. Limit coffee to three to five 8-oz cups per day.  Limit caffeinated beverages that have added calories, such as soda and energy drinks.  Limit alcohol intake to no more than 1 drink a day for nonpregnant women and 2 drinks a day for men. One drink equals 12 oz of beer (355 mL), 5 oz of wine (148 mL), or 1 oz of hard liquor (44 mL). Seasoning and other foods  Avoid adding excess amounts of salt to your foods. Try flavoring foods with herbs and spices instead of salt.  Avoid adding sugar to foods.  Try using oil-based dressings, sauces, and spreads instead of solid fats. This information is based on general [place] nutrition guidelines. For more information, visit BuildDNA.es. Exact amounts may vary based on your nutrition needs. Summary  A healthy eating plan may help you to maintain a healthy weight, reduce the risk of chronic diseases, and stay active throughout your life.  Plan your meals. Make sure you eat the right portions of a  variety of nutrient-rich foods.  Try baking, boiling, grilling, or broiling instead of frying.  Choose healthy options in all settings, including home, work, school, restaurants, or stores. This information is not intended to replace advice given to you by your health care provider. Make sure you discuss any questions you have with your health care provider. Document Released: 06/08/2017 Document Revised: 06/08/2017 Document Reviewed: 06/08/2017 Elsevier Patient Education  2020 [place].  Prediabetes Eating Plan Prediabetes is a condition that causes blood sugar (glucose) levels to be higher than normal. This increases the risk for developing diabetes. In order to prevent diabetes from developing, your health care provider may recommend a diet and other lifestyle changes to help you:  Control your blood glucose levels.  Improve your cholesterol levels.  Manage your blood pressure. Your health care provider may recommend working with a diet and nutrition specialist (dietitian) to make a meal plan that is best for you. What are tips for following this plan? Lifestyle  Set weight loss goals with the help of your health care team. It is recommended that most people with prediabetes lose 7% of their current body weight.  Exercise for at least 30 minutes at least 5 days a week.  Attend a support group or seek ongoing support from a mental health counselor.  Take over-the-counter and prescription medicines only as told by your health care provider. Reading food labels  Read food labels to check the amount of fat, salt (sodium), and sugar in prepackaged foods. Avoid foods that have: ? Saturated fats. ? Trans fats. ? Added sugars.  Avoid foods that have more than 300 milligrams (mg) of sodium per serving. Limit your daily sodium intake to less than 2,300 mg each day. Shopping  Avoid buying pre-made and processed foods. Cooking  Cook with olive oil. Do not use butter, lard, or  ghee.  Bake, broil, grill, or boil foods. Avoid frying. Meal planning   Work with your dietitian to develop an eating plan that is right for you. This may include: ? Tracking how many calories you take in. Use a food diary, notebook, or mobile application to track what you eat at each meal. ? Using the glycemic index (GI) to plan your meals. The index tells  you how quickly a food will raise your blood glucose. Choose low-GI foods. These foods take a longer time to raise blood glucose.  Consider following a Mediterranean diet. This diet includes: ? Several servings each day of fresh fruits and vegetables. ? Eating fish at least twice a week. ? Several servings each day of whole grains, beans, nuts, and seeds. ? Using olive oil instead of other fats. ? Moderate alcohol consumption. ? Eating small amounts of red meat and whole-fat dairy.  If you have high blood pressure, you may need to limit your sodium intake or follow a diet such as the DASH eating plan. DASH is an eating plan that aims to lower high blood pressure. What foods are recommended? The items listed below may not be a complete list. Talk with your dietitian about what dietary choices are best for you. Grains Whole grains, such as whole-wheat or whole-grain breads, crackers, cereals, and pasta. Unsweetened oatmeal. Bulgur. Barley. Quinoa. Brown rice. Corn or whole-wheat flour tortillas or taco shells. Vegetables Lettuce. Spinach. Peas. Beets. Cauliflower. Cabbage. Broccoli. Carrots. Tomatoes. Squash. Eggplant. Herbs. Peppers. Onions. Cucumbers. Brussels sprouts. Fruits Berries. Bananas. Apples. Oranges. Grapes. Papaya. Mango. Pomegranate. Kiwi. Grapefruit. Cherries. Meats and other protein foods Seafood. Poultry without skin. Lean cuts of pork and beef. Tofu. Eggs. Nuts. Beans. Dairy Low-fat or fat-free dairy products, such as yogurt, cottage cheese, and cheese. Beverages Water. Tea. Coffee. Sugar-free or diet soda. Seltzer  water. Lowfat or no-fat milk. Milk alternatives, such as soy or almond milk. Fats and oils Olive oil. Canola oil. Sunflower oil. Grapeseed oil. Avocado. Walnuts. Sweets and desserts Sugar-free or low-fat pudding. Sugar-free or low-fat ice cream and other frozen treats. Seasoning and other foods Herbs. Sodium-free spices. Mustard. Relish. Low-fat, low-sugar ketchup. Low-fat, low-sugar barbecue sauce. Low-fat or fat-free mayonnaise. What foods are not recommended? The items listed below may not be a complete list. Talk with your dietitian about what dietary choices are best for you. Grains Refined white flour and flour products, such as bread, pasta, snack foods, and cereals. Vegetables Canned vegetables. Frozen vegetables with butter or cream sauce. Fruits Fruits canned with syrup. Meats and other protein foods Fatty cuts of meat. Poultry with skin. Breaded or fried meat. Processed meats. Dairy Full-fat yogurt, cheese, or milk. Beverages Sweetened drinks, such as sweet iced tea and soda. Fats and oils Butter. Lard. Ghee. Sweets and desserts Baked goods, such as cake, cupcakes, pastries, cookies, and cheesecake. Seasoning and other foods Spice mixes with added salt. Ketchup. Barbecue sauce. Mayonnaise. Summary  To prevent diabetes from developing, you may need to make diet and other lifestyle changes to help control blood sugar, improve cholesterol levels, and manage your blood pressure.  Set weight loss goals with the help of your health care team. It is recommended that most people with prediabetes lose 7 percent of their current body weight.  Consider following a Mediterranean diet that includes plenty of fresh fruits and vegetables, whole grains, beans, nuts, seeds, fish, lean meat, low-fat dairy, and healthy oils. This information is not intended to replace advice given to you by your health care provider. Make sure you discuss any questions you have with your health care  provider. Document Released: 07/11/2014 Document Revised: 06/18/2018 Document Reviewed: 04/30/2016 Elsevier Patient Education  2020 [place].  Preventing Type 2 Diabetes Mellitus Type 2 diabetes (type 2 diabetes mellitus) is a long-term (chronic) disease that affects blood sugar (glucose) levels. Normally, a hormone called insulin allows glucose to enter cells in the  body. The cells use glucose for energy. In type 2 diabetes, one or both of these problems may be present:  The body does not make enough insulin.  The body does not respond properly to insulin that it makes (insulin resistance). Insulin resistance or lack of insulin causes excess glucose to build up in the blood instead of going into cells. As a result, high blood glucose (hyperglycemia) develops, which can cause many complications. Being overweight or obese and having an inactive (sedentary) lifestyle can increase your risk for diabetes. Type 2 diabetes can be delayed or prevented by making certain nutrition and lifestyle changes. What nutrition changes can be made?   Eat healthy meals and snacks regularly. Keep a healthy snack with you for when you get hungry between meals, such as fruit or a handful of nuts.  Eat lean meats and proteins that are low in saturated fats, such as chicken, fish, egg whites, and beans. Avoid processed meats.  Eat plenty of fruits and vegetables and plenty of grains that have not been processed (whole grains). It is recommended that you eat: ? 1?2 cups of fruit every day. ? 2?3 cups of vegetables every day. ? 6?8 oz of whole grains every day, such as oats, whole wheat, bulgur, brown rice, quinoa, and millet.  Eat low-fat dairy products, such as milk, yogurt, and cheese.  Eat foods that contain healthy fats, such as nuts, avocado, olive oil, and canola oil.  Drink water throughout the day. Avoid drinks that contain added sugar, such as soda or sweet tea.  Follow instructions from your  health care provider about specific eating or drinking restrictions.  Control how much food you eat at a time (portion size). ? Check food labels to find out the serving sizes of foods. ? Use a kitchen scale to weigh amounts of foods.  Saute or steam food instead of frying it. Cook with water or broth instead of oils or butter.  Limit your intake of: ? Salt (sodium). Have no more than 1 tsp (2,400 mg) of sodium a day. If you have heart disease or high blood pressure, have less than ? tsp (1,500 mg) of sodium a day. ? Saturated fat. This is fat that is solid at room temperature, such as butter or fat on meat. What lifestyle changes can be made? Activity   Do moderate-intensity physical activity for at least 30 minutes on at least 5 days of the week, or as much as told by your health care provider.  Ask your health care provider what activities are safe for you. A mix of physical activities may be best, such as walking, swimming, cycling, and strength training.  Try to add physical activity into your day. For example: ? Park in spots that are farther away than usual, so that you walk more. For example, park in a far corner of the parking lot when you go to the office or the grocery store. ? Take a walk during your lunch break. ? Use stairs instead of elevators or escalators. Weight Loss  Lose weight as directed. Your health care provider can determine how much weight loss is best for you and can help you lose weight safely.  If you are overweight or obese, you may be instructed to lose at least 5?7 % of your body weight. Alcohol and Tobacco   Limit alcohol intake to no more than 1 drink a day for nonpregnant women and 2 drinks a day for men. One drink equals 12 oz  of beer, 5 oz of wine, or 1 oz of hard liquor.  Do not use any tobacco products, such as cigarettes, chewing tobacco, and e-cigarettes. If you need help quitting, ask your health care provider. Work With [place]  Provider  Have your blood glucose tested regularly, as told by your health care provider.  Discuss your risk factors and how you can reduce your risk for diabetes.  Get screening tests as told by your health care provider. You may have screening tests regularly, especially if you have certain risk factors for type 2 diabetes.  Make an appointment with a diet and nutrition specialist (registered dietitian). A registered dietitian can help you make a healthy eating plan and can help you understand portion sizes and food labels. Why are these changes important?  It is possible to prevent or delay type 2 diabetes and related health problems by making lifestyle and nutrition changes.  It can be difficult to recognize signs of type 2 diabetes. The best way to avoid possible damage to your body is to take actions to prevent the disease before you develop symptoms. What can happen if changes are not made?  Your blood glucose levels may keep increasing. Having high blood glucose for a long time is dangerous. Too much glucose in your blood can damage your blood vessels, heart, kidneys, nerves, and eyes.  You may develop prediabetes or type 2 diabetes. Type 2 diabetes can lead to many chronic health problems and complications, such as: ? Heart disease. ? Stroke. ? Blindness. ? Kidney disease. ? Depression. ? Poor circulation in the feet and legs, which could lead to surgical removal (amputation) in severe cases. Where to find support  Ask your health care provider to recommend a registered dietitian, diabetes educator, or weight loss program.  Look for local or online weight loss groups.  Join a gym, fitness club, or outdoor activity group, such as a walking club. Where to find more information To learn more about diabetes and diabetes prevention, visit:  American Diabetes Association (ADA): www.diabetes.[employer] of Diabetes and Digestive and Kidney Diseases:  FindSpin.nl To learn more about healthy eating, visit:  The U.S. Department of Agriculture Scientist, research (physical sciences)), Choose My Plate: http://wiley-williams.com/  Office of Disease Prevention and Health Promotion (ODPHP), Dietary Guidelines: SurferLive.at Summary  You can reduce your risk for type 2 diabetes by increasing your physical activity, eating healthy foods, and losing weight as directed.  Talk with your health care provider about your risk for type 2 diabetes. Ask about any blood tests or screening tests that you need to have. This information is not intended to replace advice given to you by your health care provider. Make sure you discuss any questions you have with your health care provider. Document Released: 06/18/2015 Document Revised: 06/18/2018 Document Reviewed: 04/17/2015 Elsevier Patient Education  2020 [employer].

## 2019-02-18 NOTE — Progress Notes (Signed)
Telephone Note  I connected with Paige Phillips on 02/18/19 at 11:30 AM EST by telephone and verified that I am speaking with the correct person using two identifiers.  Location patient: home Location provider:work or home office Persons participating in the virtual visit: patient, provider  I discussed the limitations of evaluation and management by telemedicine and the availability of in person appointments. The patient expressed understanding and agreed to proceed.   HPI: 1. Dm 2 a1c 6.5 to 6.2 on metformin now on pravastatin dm guidelines tolerating doing well  2. C/o numbness b/l legs and weakness with mri 12/2017 abnormal will refer to [facility] 3. Due screening colonoscopy    ROS: See pertinent positives and negatives per HPI.  Past Medical History:  Diagnosis Date  . Chickenpox   . DDD (degenerative disc disease), lumbar   . GERD (gastroesophageal reflux disease)   . Lumbar herniated disc   . Spinal stenosis     Past Surgical History:  Procedure Laterality Date  . ABDOMINAL HYSTERECTOMY  1983   fibroids     Family History  Problem Relation Age of Onset  . Cancer Father        pancreatic cancer   . Cancer Sister        breast cancer age 74s  . Diabetes Sister   . Cancer Brother        pancreatic cancer  . Stroke Brother        died stroke  . 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  Cholecalciferol (D-3-5) 125 MCG (5000 UT) capsule, Take 5,000 Units by mouth daily., Disp: , Rfl:  .  furosemide (LASIX) 20 MG tablet, Take 0.5 tablets (10 mg total) by mouth daily as needed., Disp: 30 tablet, Rfl: 2 .  metFORMIN (GLUCOPHAGE) 500 MG tablet, Take 1 tablet (500 mg total) by mouth daily with breakfast. [name], Disp: 90 tablet, Rfl: 3 .  naproxen sodium (ALEVE) 220 MG tablet, Take 220  mg by mouth., Disp: , Rfl:  .  pravastatin (PRAVACHOL) 20 MG tablet, Take 1 tablet (20 mg total) by mouth at bedtime., Disp: 90 tablet, Rfl: 3  EXAM:  VITALS per patient if applicable:  GENERAL: alert, oriented, appears well and in no acute distress  PSYCH/NEURO: pleasant and cooperative, no obvious depression or anxiety, speech and thought processing grossly intact  ASSESSMENT AND PLAN:  Discussed the following assessment and plan:  Lumbar radiculopathy - Plan: Ambulatory referral to Neurosurgery Abnormal MRI - Plan: Ambulatory referral to Neurosurgery  Encounter for screening colonoscopy - Plan: Ambulatory referral to [facility]  Type 2 diabetes mellitus without complication, without long-term current use of insulin ([place]) Cont meds  Foot exam in person in future and disc eye exam   HM -labs 02/11/2019 improved A1C   Will get flu shot at pharmacy  declinesTdap Disc shingles, prevnar consider pna 23 given info Consider twinrix vaccine +fatty liver  Mammogram 02/03/18 neg ordered again sch next month 03/2019  DEXA 02/03/18 negative   [facility]  Pap s/p hysterectomy no h/o abnormal pap last 6-7 years ago ovaries intact  Never smoker  -we discussed possible serious and likely etiologies, options for evaluation and workup, limitations of telemedicine visit  vs in person visit, treatment, treatment risks and precautions. Pt prefers to treat via telemedicine empirically rather then risking or undertaking an in person visit at this moment. Patient agrees to seek prompt in person care if worsening, new symptoms arise, or if is not improving with treatment.   I discussed the assessment and treatment plan with the patient. The patient was provided an opportunity to ask questions and all were answered. The patient agreed with the plan and demonstrated an understanding of the instructions.   The patient was advised to call back or seek an in-person  evaluation if the symptoms worsen or if the condition fails to improve as anticipated.  Time spent 15 minutes  Delorise Jackson, MD

## 2019-03-28 DIAGNOSIS — M4316 Spondylolisthesis, lumbar region: Secondary | ICD-10-CM | POA: Diagnosis not present

## 2019-03-28 DIAGNOSIS — M545 Low back pain: Secondary | ICD-10-CM | POA: Diagnosis not present

## 2019-03-28 DIAGNOSIS — R030 Elevated blood-pressure reading, without diagnosis of hypertension: Secondary | ICD-10-CM | POA: Diagnosis not present

## 2019-03-28 DIAGNOSIS — M48061 Spinal stenosis, lumbar region without neurogenic claudication: Secondary | ICD-10-CM | POA: Diagnosis not present

## 2019-03-28 DIAGNOSIS — M25511 Pain in right shoulder: Secondary | ICD-10-CM | POA: Diagnosis not present

## 2019-04-22 ENCOUNTER — Ambulatory Visit
Admission: RE | Admit: 2019-04-22 | Discharge: 2019-04-22 | Disposition: A | Discharge status: Home / Self Care | Payer: Medicare Other | Source: Ambulatory Visit | Attending: Internal Medicine | Admitting: Internal Medicine

## 2019-04-22 ENCOUNTER — Other Ambulatory Visit: Payer: Self-pay

## 2019-04-22 DIAGNOSIS — Z1231 Encounter for screening mammogram for malignant neoplasm of breast: Secondary | ICD-10-CM | POA: Diagnosis not present

## 2019-05-16 DIAGNOSIS — M48061 Spinal stenosis, lumbar region without neurogenic claudication: Secondary | ICD-10-CM | POA: Diagnosis not present

## 2019-05-16 DIAGNOSIS — M4316 Spondylolisthesis, lumbar region: Secondary | ICD-10-CM | POA: Diagnosis not present

## 2019-05-16 DIAGNOSIS — M25511 Pain in right shoulder: Secondary | ICD-10-CM | POA: Diagnosis not present

## 2019-05-16 DIAGNOSIS — M545 Low back pain: Secondary | ICD-10-CM | POA: Diagnosis not present

## 2019-06-24 DIAGNOSIS — M545 Low back pain: Secondary | ICD-10-CM | POA: Diagnosis not present

## 2019-07-11 DIAGNOSIS — Z1159 Encounter for screening for other viral diseases: Secondary | ICD-10-CM | POA: Diagnosis not present

## 2019-07-12 DIAGNOSIS — M545 Low back pain: Secondary | ICD-10-CM | POA: Diagnosis not present

## 2019-07-14 ENCOUNTER — Encounter: Payer: Self-pay | Admitting: Internal Medicine

## 2019-07-14 DIAGNOSIS — Z1211 Encounter for screening for malignant neoplasm of colon: Secondary | ICD-10-CM | POA: Diagnosis not present

## 2019-07-14 DIAGNOSIS — D120 Benign neoplasm of cecum: Secondary | ICD-10-CM | POA: Diagnosis not present

## 2019-07-14 DIAGNOSIS — K640 First degree hemorrhoids: Secondary | ICD-10-CM | POA: Diagnosis not present

## 2019-07-14 DIAGNOSIS — D124 Benign neoplasm of descending colon: Secondary | ICD-10-CM | POA: Diagnosis not present

## 2019-07-14 DIAGNOSIS — D123 Benign neoplasm of transverse colon: Secondary | ICD-10-CM | POA: Diagnosis not present

## 2019-07-18 LAB — HM COLONOSCOPY

## 2019-07-20 DIAGNOSIS — D120 Benign neoplasm of cecum: Secondary | ICD-10-CM | POA: Diagnosis not present

## 2019-07-20 DIAGNOSIS — D123 Benign neoplasm of transverse colon: Secondary | ICD-10-CM | POA: Diagnosis not present

## 2019-07-20 DIAGNOSIS — D124 Benign neoplasm of descending colon: Secondary | ICD-10-CM | POA: Diagnosis not present

## 2019-08-23 ENCOUNTER — Encounter: Payer: Self-pay | Admitting: Internal Medicine

## 2019-08-23 ENCOUNTER — Ambulatory Visit (INDEPENDENT_AMBULATORY_CARE_PROVIDER_SITE_OTHER): Payer: Medicare Other | Admitting: Internal Medicine

## 2019-08-23 ENCOUNTER — Other Ambulatory Visit: Payer: Self-pay

## 2019-08-23 VITALS — BP 146/74 | HR 69 | Temp 97.8°F | Ht 64.0 in | Wt 241.8 lb

## 2019-08-23 DIAGNOSIS — Z0100 Encounter for examination of eyes and vision without abnormal findings: Secondary | ICD-10-CM | POA: Diagnosis not present

## 2019-08-23 DIAGNOSIS — R252 Cramp and spasm: Secondary | ICD-10-CM | POA: Insufficient documentation

## 2019-08-23 DIAGNOSIS — E559 Vitamin D deficiency, unspecified: Secondary | ICD-10-CM

## 2019-08-23 DIAGNOSIS — E119 Type 2 diabetes mellitus without complications: Secondary | ICD-10-CM

## 2019-08-23 DIAGNOSIS — M5416 Radiculopathy, lumbar region: Secondary | ICD-10-CM | POA: Diagnosis not present

## 2019-08-23 DIAGNOSIS — M4807 Spinal stenosis, lumbosacral region: Secondary | ICD-10-CM | POA: Diagnosis not present

## 2019-08-23 DIAGNOSIS — Z23 Encounter for immunization: Secondary | ICD-10-CM

## 2019-08-23 DIAGNOSIS — E538 Deficiency of other specified B group vitamins: Secondary | ICD-10-CM

## 2019-08-23 DIAGNOSIS — I10 Essential (primary) hypertension: Secondary | ICD-10-CM | POA: Diagnosis not present

## 2019-08-23 DIAGNOSIS — Z6841 Body Mass Index (BMI) 40.0 and over, adult: Secondary | ICD-10-CM

## 2019-08-23 DIAGNOSIS — R200 Anesthesia of skin: Secondary | ICD-10-CM | POA: Insufficient documentation

## 2019-08-23 DIAGNOSIS — I152 Hypertension secondary to endocrine disorders: Secondary | ICD-10-CM | POA: Insufficient documentation

## 2019-08-23 DIAGNOSIS — E1159 Type 2 diabetes mellitus with other circulatory complications: Secondary | ICD-10-CM

## 2019-08-23 MED ORDER — LOSARTAN POTASSIUM 25 MG PO TABS: 25.0000 mg | ORAL_TABLET | Freq: Every day | ORAL | 3 refills | Status: DC

## 2019-08-23 MED ORDER — SHINGRIX 50 MCG/0.5ML IM SUSR: 0.5000 mL | Freq: Once | INTRAMUSCULAR | 0 refills | Status: AC

## 2019-08-23 NOTE — Progress Notes (Addendum)
Chief Complaint  Patient presents with  . Follow-up  . Numbness    bilaterally in both feet  . Hypertension   F/u  1. MRI abnormal 12/2017 with arthritis and moderate/severe spinal stenosis/narrowing and neural foraminal narrowing with numbness b/l feet esp f/u with Dr. Vertell Limber in the past given exercises to do w/o relief and PT did not help as well  Will refer back  2. HTN with DM 2. BP not controlled today agreeable to start meds on metformin 500 and pravachol 20 will get fasting labs cmet, lipid, A1C, cbc, B12 at [facility]  Due for dm eye exam referred today  She denies h/a, dizziness with BP elevation today  3. Vit d def hx not taking D3 for now advised 4000 IU qd otc  4. C/o leg cramps b/l legs   Review of Systems  Constitutional: Negative for weight loss.  HENT: Negative for hearing loss.   Eyes: Negative for blurred vision.  Respiratory: Negative for shortness of breath.   Cardiovascular: Negative for chest pain.  Gastrointestinal: Negative for abdominal pain.  Musculoskeletal: Negative for falls.  Skin: Negative for rash.  Neurological: Positive for sensory change. Negative for dizziness and headaches.  Psychiatric/Behavioral: Negative for depression.   Past Medical History:  Diagnosis Date  . Chickenpox   . DDD (degenerative disc disease), lumbar   . GERD (gastroesophageal reflux disease)   . Lumbar herniated disc   . Spinal stenosis    Past Surgical History:  Procedure Laterality Date  . ABDOMINAL HYSTERECTOMY  1983   fibroids    Family History  Problem Relation Age of Onset  . Cancer Father        pancreatic cancer   . Cancer Sister        breast cancer age 10s  . Diabetes Sister   . Cancer Brother        pancreatic cancer  . Stroke Brother        died stroke  . ALS Mother        26s   Social History   Socioeconomic History  . Marital status: Married    Spouse name: Not on file  . Number of children: Not on file  . Years of education: Not on file   . Highest education level: Not on file  Occupational History  . Not on file  Tobacco Use  . Smoking status: Never Smoker  . Smokeless tobacco: Never Used  Vaping Use  . Vaping Use: Never used  Substance and Sexual Activity  . Alcohol use: Yes    Comment: social  . Drug use: Never  . Sexual activity: Not Currently  Other Topics Concern  . 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 Difficulty of Paying Living Expenses:   Food Insecurity:   . Worried About Charity fundraiser in the Last Year:   . Arboriculturist in the Last Year:   Transportation Needs:   . Film/video editor (Medical):   Marland Kitchen Lack of Transportation (Non-Medical):   Physical Activity:   . Days of Exercise per Week:   . Minutes of Exercise per Session:  Stress:   . Feeling of Stress :   Social Connections:   . Frequency of Communication with Friends and Family:   . Frequency of Social Gatherings with Friends and Family:   . Attends Religious Services:   . Active Member of Clubs or Organizations:   . Attends Archivist Meetings:   Marland Kitchen Marital Status:   Intimate Partner Violence:   . Fear of Current or Ex-Partner:   . Emotionally Abused:   Marland Kitchen Physically Abused:   . Sexually Abused:    Current Meds  Medication Sig  . metFORMIN (GLUCOPHAGE) 500 MG tablet Take 1 tablet (500 mg total) by mouth daily with breakfast. [name]  . naproxen sodium (ALEVE) 220 MG tablet Take 220 mg by mouth.  . pravastatin (PRAVACHOL) 20 MG tablet Take 1 tablet (20 mg total) by mouth at bedtime.   No Known Allergies No results found for this or any previous visit (from the past 2160 hour(s)). Objective  Body mass index is 41.5 kg/m. Wt Readings from Last 3 Encounters:  08/23/19 241 lb  12.8 oz (109.7 kg)  02/18/19 220 lb (99.8 kg)  02/03/18 237 lb 12.8 oz (107.9 kg)   Temp Readings from Last 3 Encounters:  08/23/19 97.8 F (36.6 C) (Temporal)  02/03/18 98 F (36.7 C) (Oral)  12/25/17 98.7 F (37.1 C) (Oral)   BP Readings from Last 3 Encounters:  08/23/19 (!) 146/74  02/03/18 134/62  12/25/17 140/80   Pulse Readings from Last 3 Encounters:  08/23/19 69  02/03/18 64  12/25/17 72    Physical Exam Vitals and nursing note reviewed.  Constitutional:      Appearance: Normal appearance. She is well-developed and well-groomed. She is morbidly obese.  HENT:     Head: Normocephalic and atraumatic.  Eyes:     Conjunctiva/sclera: Conjunctivae normal.     Pupils: Pupils are equal, round, and reactive to light.  Cardiovascular:     Rate and Rhythm: Normal rate and regular rhythm.     Heart sounds: No murmur heard.   Pulmonary:     Effort: Pulmonary effort is normal.     Breath sounds: Normal breath sounds.  Skin:    General: Skin is warm and dry.  Neurological:     General: No focal deficit present.     Mental Status: She is alert and oriented to person, place, and time. Mental status is at baseline.     Gait: Gait normal.  Psychiatric:        Attention and Perception: Attention and perception normal.        Mood and Affect: Mood and affect normal.        Speech: Speech normal.        Behavior: Behavior normal. Behavior is cooperative.        Thought Content: Thought content normal.        Cognition and Memory: Cognition and memory normal.        Judgment: Judgment normal.     Assessment  Plan  Spinal stenosis of lumbosacral region/lumbar radiculopathy with b/l numbness feet - Plan: Ambulatory referral to Neurosurgery Dr. Vertell Limber  Essential hypertension - Plan: losartan (COZAAR) 25 MG tablet, Comprehensive metabolic panel, Lipid panel, CBC with Differential/Platelet  Hypertension associated with diabetes ([place]) - Plan: Comprehensive metabolic panel,  Lipid panel, Hemoglobin A1c Referred [facility] in [place]  Cont meds  Added losartan 25 mg qd   Leg cramps Disc theraworx otc  Vitamin D deficiency rec D3 4000 IU qd  Morbid obesity with BMI of 40.0-44.9, adult ([place])  rec healthy diet and exercise   HM -labs 02/11/2019 improved A1C given [facility] order cmet, cbc lipid A1C, B!2 today  Flu shot not had 2020 declinesTdap covid 2/2  Disc shingles given Rx today  prevnar consider pna 23 given infoprev Consider twinrix vaccine +fatty liver  Mammogram 04/22/19 neg DEXA 02/03/18 [facility] h/o polyps multiple  -ROI sent [facility] 07/14/19 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 Michail Sermon  Pap s/p hysterectomy no h/o abnormal pap last 6-7 years ago ovaries intact  Never smoker   Provider: Dr. [name] [facility]

## 2019-08-23 NOTE — Patient Instructions (Addendum)
Goal blood pressure <130/<80  Buy a blood pressure cuff on losartan 25 mg daily in the am   [facility]   Vitamin D3 4000 IU daily otc (over the counter)  Increase water intake 55-64 ounces  theraworx for leg cramps otc    Losartan Tablets What is this medicine? LOSARTAN (loe SAR tan) is an angiotensin II receptor blocker, also known as an ARB. It treats high blood pressure. It can slow kidney damage in some patients. It may also be used to lower the risk of stroke. This medicine may be used for other purposes; ask your health care provider or pharmacist if you have questions. COMMON BRAND NAME(S): Cozaar What should I tell my health care provider before I take this medicine? They need to know if you have any of these conditions:  heart failure  kidney or liver disease  an unusual or allergic reaction to losartan, other medicines, foods, dyes, or preservatives  pregnant or trying to get pregnant  breast-feeding How should I use this medicine? Take this drug by mouth. Take it as directed on the prescription label at the same time every day. You can take it with or without food. If it upsets your stomach, take it with food. Keep taking it unless your health care provider tells you to stop. Talk to your health care provider about the use of this drug in children. While it may be prescribed for children as young as 6 for selected conditions, precautions do apply. Overdosage: If you think you have taken too much of this medicine contact a poison control center or emergency room at once. NOTE: This medicine is only for you. Do not share this medicine with others. What if I miss a dose? If you miss a dose, take it as soon as you can. If it is almost time for your next dose, take only that dose. Do not take double or extra doses. What may interact with this medicine?  blood pressure medicines  diuretics, especially triamterene, spironolactone, or amiloride  fluconazole  NSAIDs,  medicines for pain and inflammation, like ibuprofen or naproxen  potassium salts or potassium supplements  rifampin This list may not describe all possible interactions. Give your health care provider a list of all the medicines, herbs, non-prescription drugs, or dietary supplements you use. Also tell them if you smoke, drink alcohol, or use illegal drugs. Some items may interact with your medicine. What should I watch for while using this medicine? Visit your doctor or health care professional for regular checks on your progress. Check your blood pressure as directed. Ask your doctor or health care professional what your blood pressure should be and when you should contact him or her. Call your doctor or health care professional if you notice an irregular or fast heart beat. Women should inform their doctor if they wish to become pregnant or think they might be pregnant. There is a potential for serious side effects to an unborn child, particularly in the second or third trimester. Talk to your health care professional or pharmacist for more information. You may get drowsy or dizzy. Do not drive, use machinery, or do anything that needs mental alertness until you know how this drug affects you. Do not stand or sit up quickly, especially if you are an older patient. This reduces the risk of dizzy or fainting spells. Alcohol can make you more drowsy and dizzy. Avoid alcoholic drinks. Avoid salt substitutes unless you are told otherwise by your doctor or  health care professional. Do not treat yourself for coughs, colds, or pain while you are taking this medicine without asking your doctor or health care professional for advice. Some ingredients may increase your blood pressure. What side effects may I notice from receiving this medicine? Side effects that you should report to your doctor or health care professional as soon as possible:  confusion, dizziness, light headedness or fainting  spells  decreased amount of urine passed  difficulty breathing or swallowing, hoarseness, or tightening of the throat  fast or irregular heart beat, palpitations, or chest pain  skin rash, itching  swelling of your face, lips, tongue, hands, or feet Side effects that usually do not require medical attention (report to your doctor or health care professional if they continue or are bothersome):  cough  decreased sexual function or desire  headache  nasal congestion or stuffiness  nausea or stomach pain  sore or cramping muscles This list may not describe all possible side effects. Call your doctor for medical advice about side effects. You may report side effects to FDA at 1-800-FDA-1088. Where should I keep my medicine? Keep out of the reach of children and pets. Store at room temperature between 15 and 30 degrees C (59 and 86 degrees F). Protect from light. Keep the container tightly closed. Throw away any unused drug after the expiration date. NOTE: This sheet is a summary. It may not cover all possible information. If you have questions about this medicine, talk to your doctor, pharmacist, or health care provider.  2020 Elsevier/Gold Standard (2018-09-29 12:12:28)  Diabetes Mellitus and Nutrition, Adult When you have diabetes (diabetes mellitus), it is very important to have healthy eating habits because your blood sugar (glucose) levels are greatly affected by what you eat and drink. Eating healthy foods in the appropriate amounts, at about the same times every day, can help you:  Control your blood glucose.  Lower your risk of heart disease.  Improve your blood pressure.  Reach or maintain a healthy weight. Every person with diabetes is different, and each person has different needs for a meal plan. Your health care provider may recommend that you work with a diet and nutrition specialist (dietitian) to make a meal plan that is best for you. Your meal plan may vary  depending on factors such as:  The calories you need.  The medicines you take.  Your weight.  Your blood glucose, blood pressure, and cholesterol levels.  Your activity level.  Other health conditions you have, such as heart or kidney disease. How do carbohydrates affect me? Carbohydrates, also called carbs, affect your blood glucose level more than any other type of food. Eating carbs naturally raises the amount of glucose in your blood. Carb counting is a method for keeping track of how many carbs you eat. Counting carbs is important to keep your blood glucose at a healthy level, especially if you use insulin or take certain oral diabetes medicines. It is important to know how many carbs you can safely have in each meal. This is different for every person. Your dietitian can help you calculate how many carbs you should have at each meal and for each snack. Foods that contain carbs include:  Bread, cereal, rice, pasta, and crackers.  Potatoes and corn.  Peas, beans, and lentils.  Milk and yogurt.  Fruit and juice.  Desserts, such as cakes, cookies, ice cream, and candy. How does alcohol affect me? Alcohol can cause a sudden decrease in blood glucose (  hypoglycemia), especially if you use insulin or take certain oral diabetes medicines. Hypoglycemia can be a life-threatening condition. Symptoms of hypoglycemia (sleepiness, dizziness, and confusion) are similar to symptoms of having too much alcohol. If your health care provider says that alcohol is safe for you, follow these guidelines:  Limit alcohol intake to no more than 1 drink per day for nonpregnant women and 2 drinks per day for men. One drink equals 12 oz of beer, 5 oz of wine, or 1 oz of hard liquor.  Do not drink on an empty stomach.  Keep yourself hydrated with water, diet soda, or unsweetened iced tea.  Keep in mind that regular soda, juice, and other mixers may contain a lot of sugar and must be counted as  carbs. What are tips for following this plan?  Reading food labels  Start by checking the serving size on the "Nutrition Facts" label of packaged foods and drinks. The amount of calories, carbs, fats, and other nutrients listed on the label is based on one serving of the item. Many items contain more than one serving per package.  Check the total grams (g) of carbs in one serving. You can calculate the number of servings of carbs in one serving by dividing the total carbs by 15. For example, if a food has 30 g of total carbs, it would be equal to 2 servings of carbs.  Check the number of grams (g) of saturated and trans fats in one serving. Choose foods that have low or no amount of these fats.  Check the number of milligrams (mg) of salt (sodium) in one serving. Most people should limit total sodium intake to less than 2,300 mg per day.  Always check the nutrition information of foods labeled as "low-fat" or "nonfat". These foods may be higher in added sugar or refined carbs and should be avoided.  Talk to your dietitian to identify your daily goals for nutrients listed on the label. Shopping  Avoid buying canned, premade, or processed foods. These foods tend to be high in fat, sodium, and added sugar.  Shop around the outside edge of the grocery store. This includes fresh fruits and vegetables, bulk grains, fresh meats, and fresh dairy. Cooking  Use low-heat cooking methods, such as baking, instead of high-heat cooking methods like deep frying.  Cook using healthy oils, such as olive, canola, or sunflower oil.  Avoid cooking with butter, cream, or high-fat meats. Meal planning  Eat meals and snacks regularly, preferably at the same times every day. Avoid going long periods of time without eating.  Eat foods high in fiber, such as fresh fruits, vegetables, beans, and whole grains. Talk to your dietitian about how many servings of carbs you can eat at each meal.  Eat 4-6 ounces (oz)  of lean protein each day, such as lean meat, chicken, fish, eggs, or tofu. One oz of lean protein is equal to: ? 1 oz of meat, chicken, or fish. ? 1 egg. ?  cup of tofu.  Eat some foods each day that contain healthy fats, such as avocado, nuts, seeds, and fish. Lifestyle  Check your blood glucose regularly.  Exercise regularly as told by your health care provider. This may include: ? 150 minutes of moderate-intensity or vigorous-intensity exercise each week. This could be brisk walking, biking, or water aerobics. ? Stretching and doing strength exercises, such as yoga or weightlifting, at least 2 times a week.  Take medicines as told by your health care provider.  Do not use any products that contain nicotine or tobacco, such as cigarettes and e-cigarettes. If you need help quitting, ask your health care provider.  Work with a Social worker or diabetes educator to identify strategies to manage stress and any emotional and social challenges. Questions to ask a health care provider  Do I need to meet with a diabetes educator?  Do I need to meet with a dietitian?  What number can I call if I have questions?  When are the best times to check my blood glucose? Where to find more information:  American Diabetes Association: diabetes.org  Academy of Nutrition and Dietetics: www.eatright.[employer] of Diabetes and Digestive and Kidney Diseases (NIH): [employer] Summary  A healthy meal plan will help you control your blood glucose and maintain a healthy lifestyle.  Working with a diet and nutrition specialist (dietitian) can help you make a meal plan that is best for you.  Keep in mind that carbohydrates (carbs) and alcohol have immediate effects on your blood glucose levels. It is important to count carbs and to use alcohol carefully. This information is not intended to replace advice given to you by your health care provider. Make sure you discuss any questions you  have with your health care provider. Document Revised: 02/06/2017 Document Reviewed: 03/31/2016 Elsevier Patient Education  [place].  Leg Cramps Leg cramps occur when one or more muscles tighten and you have no control over this tightening (involuntary muscle contraction). Muscle cramps can develop in any muscle, but the most common place is in the calf muscles of the leg. Those cramps can occur during exercise or when you are at rest. Leg cramps are painful, and they may last for a few seconds to a few minutes. Cramps may return several times before they finally stop. Usually, leg cramps are not caused by a serious medical problem. In many cases, the cause is not known. Some common causes include:  Excessive physical effort (overexertion), such as during intense exercise.  Overuse from repetitive motions, or doing the same thing over and over.  Staying in a certain position for a long period of time.  Improper preparation, form, or technique while performing a sport or an activity.  Dehydration.  Injury.  Side effects of certain medicines.  Abnormally low levels of minerals in your blood (electrolytes), especially potassium and calcium. This could result from: ? Pregnancy. ? Taking diuretic medicines. Follow these instructions at home: Eating and drinking  Drink enough fluid to keep your urine pale yellow. Staying hydrated may help prevent cramps.  Eat a healthy diet that includes plenty of nutrients to help your muscles function. A healthy diet includes fruits and vegetables, lean protein, whole grains, and low-fat or nonfat dairy products. Managing pain, stiffness, and swelling      Try massaging, stretching, and relaxing the affected muscle. Do this for several minutes at a time.  If directed, put ice on areas that are sore or painful after a cramp: ? Put ice in a plastic bag. ? Place a towel between your skin and the bag. ? Leave the ice on for 20 minutes, 2-3  times a day.  If directed, apply heat to muscles that are tense or tight. Do this before you exercise, or as often as told by your health care provider. Use the heat source that your health care provider recommends, such as a moist heat pack or a heating pad. ? Place a towel between your skin and the heat  source. ? Leave the heat on for 20-30 minutes. ? Remove the heat if your skin turns bright red. This is especially important if you are unable to feel pain, heat, or cold. You may have a greater risk of getting burned.  Try taking hot showers or baths to help relax tight muscles. General instructions  If you are having frequent leg cramps, avoid intense exercise for several days.  Take over-the-counter and prescription medicines only as told by your health care provider.  Keep all follow-up visits as told by your health care provider. This is important. Contact a health care provider if:  Your leg cramps get more severe or more frequent, or they do not improve over time.  Your foot becomes cold, numb, or blue. Summary  Muscle cramps can develop in any muscle, but the most common place is in the calf muscles of the leg.  Leg cramps are painful, and they may last for a few seconds to a few minutes.  Usually, leg cramps are not caused by a serious medical problem. Often, the cause is not known.  Stay hydrated and take over-the-counter and prescription medicines only as told by your health care provider. This information is not intended to replace advice given to you by your health care provider. Make sure you discuss any questions you have with your health care provider. Document Revised: 02/06/2017 Document Reviewed: 12/04/2016 Elsevier Patient Education  [place] DASH stands for "Dietary Approaches to Stop Hypertension." The DASH eating plan is a healthy eating plan that has been shown to reduce high blood pressure (hypertension). It may also reduce  your risk for type 2 diabetes, heart disease, and stroke. The DASH eating plan may also help with weight loss. What are tips for following this plan?  General guidelines  Avoid eating more than 2,300 mg (milligrams) of salt (sodium) a day. If you have hypertension, you may need to reduce your sodium intake to 1,500 mg a day.  Limit alcohol intake to no more than 1 drink a day for nonpregnant women and 2 drinks a day for men. One drink equals 12 oz of beer, 5 oz of wine, or 1 oz of hard liquor.  Work with your health care provider to maintain a healthy body weight or to lose weight. Ask what an ideal weight is for you.  Get at least 30 minutes of exercise that causes your heart to beat faster (aerobic exercise) most days of the week. Activities may include walking, swimming, or biking.  Work with your health care provider or diet and nutrition specialist (dietitian) to adjust your eating plan to your individual calorie needs. Reading food labels   Check food labels for the amount of sodium per serving. Choose foods with less than 5 percent of the Daily Value of sodium. Generally, foods with less than 300 mg of sodium per serving fit into this eating plan.  To find whole grains, look for the word "whole" as the first word in the ingredient list. Shopping  Buy products labeled as "low-sodium" or "no salt added."  Buy fresh foods. Avoid canned foods and premade or frozen meals. Cooking  Avoid adding salt when cooking. Use salt-free seasonings or herbs instead of table salt or sea salt. Check with your health care provider or pharmacist before using salt substitutes.  Do not fry foods. Cook foods using healthy methods such as baking, boiling, grilling, and broiling instead.  Cook with heart-healthy oils, such as  olive, canola, soybean, or sunflower oil. Meal planning  Eat a balanced diet that includes: ? 5 or more servings of fruits and vegetables each day. At each meal, try to fill  half of your plate with fruits and vegetables. ? Up to 6-8 servings of whole grains each day. ? Less than 6 oz of lean meat, poultry, or fish each day. A 3-oz serving of meat is about the same size as a deck of cards. One egg equals 1 oz. ? 2 servings of low-fat dairy each day. ? A serving of nuts, seeds, or beans 5 times each week. ? Heart-healthy fats. Healthy fats called Omega-3 fatty acids are found in foods such as flaxseeds and coldwater fish, like sardines, salmon, and mackerel.  Limit how much you eat of the following: ? Canned or prepackaged foods. ? Food that is high in trans fat, such as fried foods. ? Food that is high in saturated fat, such as fatty meat. ? Sweets, desserts, sugary drinks, and other foods with added sugar. ? Full-fat dairy products.  Do not salt foods before eating.  Try to eat at least 2 vegetarian meals each week.  Eat more home-cooked food and less restaurant, buffet, and fast food.  When eating at a restaurant, ask that your food be prepared with less salt or no salt, if possible. What foods are recommended? The items listed may not be a complete list. Talk with your dietitian about what dietary choices are best for you. Grains Whole-grain or whole-wheat bread. Whole-grain or whole-wheat pasta. Brown rice. [place]. Bulgur. Whole-grain and low-sodium cereals. Pita bread. Low-fat, low-sodium crackers. Whole-wheat flour tortillas. Vegetables Fresh or frozen vegetables (raw, steamed, roasted, or grilled). Low-sodium or reduced-sodium tomato and vegetable juice. Low-sodium or reduced-sodium tomato sauce and tomato paste. Low-sodium or reduced-sodium canned vegetables. Fruits All fresh, dried, or frozen fruit. Canned fruit in natural juice (without added sugar). Meat and other protein foods Skinless chicken or [place]. Ground chicken or [place]. Pork with fat trimmed off. Fish and seafood. Egg whites. Dried beans, peas, or lentils. Unsalted nuts, nut  butters, and seeds. Unsalted canned beans. Lean cuts of beef with fat trimmed off. Low-sodium, lean deli meat. Dairy Low-fat (1%) or fat-free (skim) milk. Fat-free, low-fat, or reduced-fat cheeses. Nonfat, low-sodium ricotta or cottage cheese. Low-fat or nonfat yogurt. Low-fat, low-sodium cheese. Fats and oils Soft margarine without trans fats. Vegetable oil. Low-fat, reduced-fat, or light mayonnaise and salad dressings (reduced-sodium). Canola, safflower, olive, soybean, and sunflower oils. Avocado. Seasoning and other foods Herbs. Spices. Seasoning mixes without salt. Unsalted popcorn and pretzels. Fat-free sweets. What foods are not recommended? The items listed may not be a complete list. Talk with your dietitian about what dietary choices are best for you. Grains Baked goods made with fat, such as croissants, muffins, or some breads. Dry pasta or rice meal packs. Vegetables Creamed or fried vegetables. Vegetables in a cheese sauce. Regular canned vegetables (not low-sodium or reduced-sodium). Regular canned tomato sauce and paste (not low-sodium or reduced-sodium). Regular tomato and vegetable juice (not low-sodium or reduced-sodium). Angie Fava. Olives. Fruits Canned fruit in a light or heavy syrup. Fried fruit. Fruit in cream or butter sauce. Meat and other protein foods Fatty cuts of meat. Ribs. Fried meat. Berniece Salines. Sausage. Bologna and other processed lunch meats. Salami. Fatback. Hotdogs. Bratwurst. Salted nuts and seeds. Canned beans with added salt. Canned or smoked fish. Whole eggs or egg yolks. Chicken or [place] with skin. Dairy Whole or 2% milk, cream, and half-and-half.  Whole or full-fat cream cheese. Whole-fat or sweetened yogurt. Full-fat cheese. Nondairy creamers. Whipped toppings. Processed cheese and cheese spreads. Fats and oils Butter. Stick margarine. Lard. Shortening. Ghee. Bacon fat. Tropical oils, such as coconut, palm kernel, or palm oil. Seasoning and other foods Salted  popcorn and pretzels. Onion salt, garlic salt, seasoned salt, table salt, and sea salt. Worcestershire sauce. Tartar sauce. Barbecue sauce. Teriyaki sauce. Soy sauce, including reduced-sodium. Steak sauce. Canned and packaged gravies. Fish sauce. Oyster sauce. Cocktail sauce. Horseradish that you find on the shelf. Ketchup. Mustard. Meat flavorings and tenderizers. Bouillon cubes. Hot sauce and Tabasco sauce. Premade or packaged marinades. Premade or packaged taco seasonings. Relishes. Regular salad dressings. Where to find more information:  National Heart, Lung, and [place]: https://wilson-eaton.com/  American Heart Association: www.heart.org Summary  The DASH eating plan is a healthy eating plan that has been shown to reduce high blood pressure (hypertension). It may also reduce your risk for type 2 diabetes, heart disease, and stroke.  With the DASH eating plan, you should limit salt (sodium) intake to 2,300 mg a day. If you have hypertension, you may need to reduce your sodium intake to 1,500 mg a day.  When on the DASH eating plan, aim to eat more fresh fruits and vegetables, whole grains, lean proteins, low-fat dairy, and heart-healthy fats.  Work with your health care provider or diet and nutrition specialist (dietitian) to adjust your eating plan to your individual calorie needs. This information is not intended to replace advice given to you by your health care provider. Make sure you discuss any questions you have with your health care provider. Document Revised: 02/06/2017 Document Reviewed: 02/18/2016 Elsevier Patient Education  [place].  Spinal Stenosis  Spinal stenosis occurs when the open space (spinal canal) between the bones of your spine (vertebrae) narrows, putting pressure on the spinal cord or nerves. What are the causes? This condition is caused by areas of bone pushing into the central canals of your vertebrae. This condition may be present at birth  (congenital), or it may be caused by:  Arthritic deterioration of your vertebrae (spinal degeneration). This usually starts around age 62.  Injury or trauma to the spine.  Tumors in the spine.  Calcium deposits in the spine. What are the signs or symptoms? Symptoms of this condition include:  Pain in the neck or back that is generally worse with activities, particularly when standing and walking.  Numbness, tingling, hot or cold sensations, weakness, or weariness in your legs.  Pain going up and down the leg (sciatica).  Frequent episodes of falling.  A foot-slapping gait that leads to muscle weakness. In more serious cases, you may develop:  Problemspassing stool or passing urine.  Difficulty having sex.  Loss of feeling in part or all of your leg. Symptoms may come on slowly and get worse over time. How is this diagnosed? This condition is diagnosed based on your medical history and a physical exam. Tests will also be done, such as:  MRI.  CT scan.  X-ray. How is this treated? Treatment for this condition often focuses on managing your pain and any other symptoms. Treatment may include:  Practicing good posture to lessen pressure on your nerves.  Exercising to strengthen muscles, build endurance, improve balance, and maintain good joint movement (range of motion).  Losing weight, if needed.  Taking medicines to reduce swelling, inflammation, or pain.  Assistive devices, such as a corset or brace. In some cases, surgery may  be needed. The most common procedure is decompression laminectomy. This is done to remove excess bone that puts pressure on your nerve roots. Follow these instructions at home: Managing pain, stiffness, and swelling  Do all exercises and stretches as told by your health care provider.  Practice good posture. If you were given a brace or a corset, wear it as told by your health care provider.  Do not do any activities that cause pain. Ask  your health care provider what activities are safe for you.  Do not lift anything that is heavier than 10 lb (4.5 kg) or the limit that your health care provider tells you.  Maintain a healthy weight. Talk with your health care provider if you need help losing weight.  If directed, apply heat to the affected area as often as told by your health care provider. Use the heat source that your health care provider recommends, such as a moist heat pack or a heating pad. ? Place a towel between your skin and the heat source. ? Leave the heat on for 20-30 minutes. ? Remove the heat if your skin turns bright red. This is especially important if you are not able to feel pain, heat, or cold. You may have a greater risk of getting burned. General instructions  Take over-the-counter and prescription medicines only as told by your health care provider.  Do not use any products that contain nicotine or tobacco, such as cigarettes and e-cigarettes. If you need help quitting, ask your health care provider.  Eat a healthy diet. This includes plenty of fruits and vegetables, whole grains, and low-fat (lean) protein.  Keep all follow-up visits as told by your health care provider. This is important. Contact a health care provider if:  Your symptoms do not get better or they get worse.  You have a fever. Get help right away if:  You have new or worse pain in your neck or upper back.  You have severe pain that cannot be controlled with medicines.  You are dizzy.  You have vision problems, blurred vision, or double vision.  You have a severe headache that is worse when you stand.  You have nausea or you vomit.  You develop new or worse numbness or tingling in your back or legs.  You have pain, redness, swelling, or warmth in your arm or leg. Summary  Spinal stenosis occurs when the open space (spinal canal) between the bones of your spine (vertebrae) narrows. This narrowing puts pressure on the  spinal cord or nerves.  Spinal stenosis can cause numbness, weakness, or pain in the neck, back, and legs.  This condition may be caused by a birth defect, arthritic deterioration of your vertebrae, injury, tumors, or calcium deposits.  This condition is usually diagnosed with MRIs, CT scans, and X-rays. This information is not intended to replace advice given to you by your health care provider. Make sure you discuss any questions you have with your health care provider. Document Revised: 02/06/2017 Document Reviewed: 01/30/2016 Elsevier Patient Education  2020 [employer].

## 2019-09-06 ENCOUNTER — Encounter: Payer: Self-pay | Admitting: Internal Medicine

## 2019-09-08 ENCOUNTER — Telehealth: Payer: Self-pay | Admitting: Internal Medicine

## 2019-09-08 NOTE — Telephone Encounter (Signed)
Faxed request for clear fax for patient's pathology report to [facility] 09-08-19

## 2019-09-16 ENCOUNTER — Telehealth: Payer: Self-pay | Admitting: Internal Medicine

## 2019-09-16 NOTE — Telephone Encounter (Signed)
Rejection Reason - Patient did not respond - called pt multiple times pt did not answer or return call" [facility] [place] said about 23 hours ago  "Called pt to scheduled on 06/15 and 06/18 - no answer- Lvm " [facility] [place] said 21 days ago

## 2019-09-16 NOTE — Telephone Encounter (Signed)
Inform pt to call [facility] in [place] for appt they tried to call her no answer

## 2019-09-22 ENCOUNTER — Telehealth: Payer: Self-pay | Admitting: Internal Medicine

## 2019-09-22 NOTE — Telephone Encounter (Signed)
Left message for patient to call back and schedule Medicare Annual Wellness Visit (AWV) either virtually or audio only.  No hx of AWV; please schedule at anytime with Denisa O'Brien-Blaney at [facility]   

## 2019-09-25 ENCOUNTER — Encounter: Payer: Self-pay | Admitting: Internal Medicine

## 2019-09-26 DIAGNOSIS — I10 Essential (primary) hypertension: Secondary | ICD-10-CM | POA: Diagnosis not present

## 2019-09-26 DIAGNOSIS — M5416 Radiculopathy, lumbar region: Secondary | ICD-10-CM | POA: Diagnosis not present

## 2019-09-26 DIAGNOSIS — M48061 Spinal stenosis, lumbar region without neurogenic claudication: Secondary | ICD-10-CM | POA: Diagnosis not present

## 2019-09-26 DIAGNOSIS — M4316 Spondylolisthesis, lumbar region: Secondary | ICD-10-CM | POA: Diagnosis not present

## 2019-09-26 DIAGNOSIS — M545 Low back pain: Secondary | ICD-10-CM | POA: Diagnosis not present

## 2019-09-27 ENCOUNTER — Other Ambulatory Visit: Payer: Self-pay | Admitting: Neurosurgery

## 2019-10-04 DIAGNOSIS — M545 Low back pain: Secondary | ICD-10-CM | POA: Diagnosis not present

## 2019-10-04 DIAGNOSIS — M4316 Spondylolisthesis, lumbar region: Secondary | ICD-10-CM | POA: Diagnosis not present

## 2019-10-07 DIAGNOSIS — M545 Low back pain: Secondary | ICD-10-CM | POA: Diagnosis not present

## 2019-10-07 DIAGNOSIS — M5416 Radiculopathy, lumbar region: Secondary | ICD-10-CM | POA: Diagnosis not present

## 2019-10-07 DIAGNOSIS — M48061 Spinal stenosis, lumbar region without neurogenic claudication: Secondary | ICD-10-CM | POA: Diagnosis not present

## 2019-10-07 DIAGNOSIS — I10 Essential (primary) hypertension: Secondary | ICD-10-CM | POA: Diagnosis not present

## 2019-10-07 DIAGNOSIS — M4316 Spondylolisthesis, lumbar region: Secondary | ICD-10-CM | POA: Diagnosis not present

## 2019-10-14 ENCOUNTER — Telehealth: Payer: Self-pay | Admitting: Internal Medicine

## 2019-10-14 NOTE — Telephone Encounter (Signed)
Called and left a msg requesting a call back" [employer] said about 1 month ago  "Left final msg requesting a call back" [employer] said 16 days ago  "Rejection Reason - Patient did not respond - Never received a call back" [employer] said about 2 hours ago  "Never received a call back" [employer] said about 2 hours ago

## 2019-11-21 DIAGNOSIS — M48061 Spinal stenosis, lumbar region without neurogenic claudication: Secondary | ICD-10-CM | POA: Diagnosis not present

## 2019-11-23 NOTE — Pre-Procedure Instructions (Signed)
[facility] [place], [place] AT [facility] [address] [place] [address] Phone: [phone] Fax: [phone]     Your procedure is scheduled on Tuesday September 21st.  Report to [facility] Main Entrance "A" at 5:30 A.M., and check in at the Admitting office.  Call this number if you have problems the morning of surgery:  [phone]  Call [phone] if you have any questions prior to your surgery date Monday-Friday 8am-4pm    Remember:  Do not eat after midnight the night before your surgery  You may drink clear liquids until 4:30 the morning of your surgery.   Clear liquids allowed are: Water, Non-Citrus Juices (without pulp), Carbonated Beverages, Clear Tea, Black Coffee Only, and Gatorade    Take these medicines the morning of surgery with A SIP OF WATER  acetaminophen (TYLENOL) if needed  As of today, STOP taking any Aspirin (unless otherwise instructed by your surgeon) Aleve, Naproxen, Ibuprofen, Motrin, Advil, Goody's, BC's, all herbal medications, fish oil, and all vitamins.   HOW TO MANAGE YOUR DIABETES BEFORE AND AFTER SURGERY  Why is it important to control my blood sugar before and after surgery? . Improving blood sugar levels before and after surgery helps healing and can limit problems. . A way of improving blood sugar control is eating a healthy diet by: o  Eating less sugar and carbohydrates o  Increasing activity/exercise o  Talking with your doctor about reaching your blood sugar goals . High blood sugars (greater than 180 mg/dL) can raise your risk of infections and slow your recovery, so you will need to focus on controlling your diabetes during the weeks before surgery. . Make sure that the doctor who takes care of your diabetes knows about your planned surgery including the date and location.  How do I manage my blood sugar before surgery? . Check your blood sugar at least 4 times a day,  starting 2 days before surgery, to make sure that the level is not too high or low. o Check your blood sugar the morning of your surgery when you wake up and every 2 hours until you get to the Short Stay unit. . If your blood sugar is less than 70 mg/dL, you will need to treat for low blood sugar: o Do not take insulin. o Treat a low blood sugar (less than 70 mg/dL) with  cup of clear juice (cranberry or apple), 4 glucose tablets, OR glucose gel. Recheck blood sugar in 15 minutes after treatment (to make sure it is greater than 70 mg/dL). If your blood sugar is not greater than 70 mg/dL on recheck, call [phone] o  for further instructions. . Report your blood sugar to the short stay nurse when you get to Short Stay.  . If you are admitted to the hospital after surgery: o Your blood sugar will be checked by the staff and you will probably be given insulin after surgery (instead of oral diabetes medicines) to make sure you have good blood sugar levels. o The goal for blood sugar control after surgery is 80-180 mg/dL.     WHAT DO I DO ABOUT MY DIABETES MEDICATION?   Marland Kitchen Do not take oral diabetes medicines (pills): metFORMIN (GLUCOPHAGE) the morning of surgery.              Do not wear jewelry, make up, or nail polish  Do not wear lotions, powders, perfumes/colognes, or deodorant.            Do not shave 48 hours prior to surgery.  Men may shave face and neck.            Do not bring valuables to the hospital.            [facility] is not responsible for any belongings or valuables.  Do NOT Smoke (Tobacco/Vaping) or drink Alcohol 24 hours prior to your procedure If you use a CPAP at night, you may bring all equipment for your overnight stay.   Contacts, glasses, dentures or bridgework may not be worn into surgery.      For patients admitted to the hospital, discharge time will be determined by your treatment team.   Patients discharged the day of surgery will not be  allowed to drive home, and someone needs to stay with them for 24 hours.    Special instructions:   [place]- Preparing For Surgery  Before surgery, you can play an important role. Because skin is not sterile, your skin needs to be as free of germs as possible. You can reduce the number of germs on your skin by washing with CHG (chlorahexidine gluconate) Soap before surgery.  CHG is an antiseptic cleaner which kills germs and bonds with the skin to continue killing germs even after washing.    Oral Hygiene is also important to reduce your risk of infection.  Remember - BRUSH YOUR TEETH THE MORNING OF SURGERY WITH YOUR REGULAR TOOTHPASTE  Please do not use if you have an allergy to CHG or antibacterial soaps. If your skin becomes reddened/irritated stop using the CHG.  Do not shave (including legs and underarms) for at least 48 hours prior to first CHG shower. It is OK to shave your face.  Please follow these instructions carefully.   1. Shower the NIGHT BEFORE SURGERY and the MORNING OF SURGERY with CHG Soap.   2. If you chose to wash your hair, wash your hair first as usual with your normal shampoo.  3. After you shampoo, rinse your hair and body thoroughly to remove the shampoo.  4. Use CHG as you would any other liquid soap. You can apply CHG directly to the skin and wash gently with a scrungie or a clean washcloth.   5. Apply the CHG Soap to your body ONLY FROM THE NECK DOWN.  Do not use on open wounds or open sores. Avoid contact with your eyes, ears, mouth and genitals (private parts). Wash Face and genitals (private parts)  with your normal soap.   6. Wash thoroughly, paying special attention to the area where your surgery will be performed.  7. Thoroughly rinse your body with warm water from the neck down.  8. DO NOT shower/wash with your normal soap after using and rinsing off the CHG Soap.  9. Pat yourself dry with a CLEAN TOWEL.  10. Wear CLEAN PAJAMAS to bed the night  before surgery  11. Place CLEAN SHEETS on your bed the night of your first shower and DO NOT SLEEP WITH PETS.   Day of Surgery: Wear Clean/Comfortable clothing the morning of surgery Do not apply any deodorants/lotions.   Remember to brush your teeth WITH YOUR REGULAR TOOTHPASTE.   Please read over the following fact sheets that you were given.

## 2019-11-24 ENCOUNTER — Encounter (HOSPITAL_COMMUNITY): Payer: Self-pay

## 2019-11-24 ENCOUNTER — Encounter (HOSPITAL_COMMUNITY)
Admission: RE | Admit: 2019-11-24 | Discharge: 2019-11-24 | Disposition: A | Discharge status: Home / Self Care | Payer: Medicare Other | Source: Ambulatory Visit | Attending: Neurosurgery | Admitting: Neurosurgery

## 2019-11-24 ENCOUNTER — Other Ambulatory Visit: Payer: Self-pay

## 2019-11-24 DIAGNOSIS — Z01818 Encounter for other preprocedural examination: Secondary | ICD-10-CM | POA: Insufficient documentation

## 2019-11-24 DIAGNOSIS — I10 Essential (primary) hypertension: Secondary | ICD-10-CM | POA: Diagnosis not present

## 2019-11-24 HISTORY — DX: Type 2 diabetes mellitus without complications: E11.9

## 2019-11-24 HISTORY — DX: Essential (primary) hypertension: I10

## 2019-11-24 LAB — GLUCOSE, CAPILLARY: Glucose-Capillary: 136 mg/dL — ABNORMAL HIGH (ref 70–99)

## 2019-11-24 LAB — TYPE AND SCREEN
ABO/RH(D): O POS
Antibody Screen: NEGATIVE

## 2019-11-24 LAB — BASIC METABOLIC PANEL
Anion gap: 11 (ref 5–15)
BUN: 33 mg/dL — ABNORMAL HIGH (ref 8–23)
CO2: 22 mmol/L (ref 22–32)
Calcium: 9.5 mg/dL (ref 8.9–10.3)
Chloride: 105 mmol/L (ref 98–111)
Creatinine, Ser: 1.38 mg/dL — ABNORMAL HIGH (ref 0.44–1.00)
GFR calc Af Amer: 45 mL/min — ABNORMAL LOW (ref >60)
GFR calc non Af Amer: 39 mL/min — ABNORMAL LOW (ref >60)
Glucose, Bld: 131 mg/dL — ABNORMAL HIGH (ref 70–99)
Potassium: 5.4 mmol/L — ABNORMAL HIGH (ref 3.5–5.1)
Sodium: 138 mmol/L (ref 135–145)

## 2019-11-24 LAB — CBC
HCT: 39.1 % (ref 36.0–46.0)
Hemoglobin: 12.1 g/dL (ref 12.0–15.0)
MCH: 30.2 pg (ref 26.0–34.0)
MCHC: 30.9 g/dL (ref 30.0–36.0)
MCV: 97.5 fL (ref 80.0–100.0)
Platelets: 266 10*3/uL (ref 150–400)
RBC: 4.01 MIL/uL (ref 3.87–5.11)
RDW: 13.7 % (ref 11.5–15.5)
WBC: 8 10*3/uL (ref 4.0–10.5)
nRBC: 0 % (ref 0.0–0.2)

## 2019-11-24 LAB — HEMOGLOBIN A1C
Hgb A1c MFr Bld: 6.1 % — ABNORMAL HIGH (ref 4.8–5.6)
Mean Plasma Glucose: 128.37 mg/dL

## 2019-11-24 LAB — SURGICAL PCR SCREEN
MRSA, PCR: NEGATIVE
Staphylococcus aureus: NEGATIVE

## 2019-11-24 NOTE — Progress Notes (Addendum)
PCP - TRACY MCLEANS-COUZZCO      IN [place]    AWARE OF SURGERY Cardiologist NA-    D  Chest x-ray - NA EKG - TODAY Stress Test - NA ECHO - NA Cardiac Cath - NA    Fasting Blood Sugar - 136 Checks Blood Sugar __0___ times a day   Aspirin Instructions:STOP  ERAS Protcol - CLEAR LIG INSTRUCTIONS GIVEN   COVID TEST- FOR THE 17TH   Anesthesia review: HTN  Patient denies shortness of breath, fever, cough and chest pain at PAT appointment   All instructions explained to the patient, with a verbal understanding of the material. Patient agrees to go over the instructions while at home for a better understanding. Patient also instructed to self quarantine after being tested for COVID-19. The opportunity to ask questions was provided.

## 2019-11-25 ENCOUNTER — Other Ambulatory Visit (HOSPITAL_COMMUNITY)
Admission: RE | Admit: 2019-11-25 | Discharge: 2019-11-25 | Disposition: A | Discharge status: Home / Self Care | Payer: Medicare Other | Source: Ambulatory Visit | Attending: Neurosurgery | Admitting: Neurosurgery

## 2019-11-25 DIAGNOSIS — Z01812 Encounter for preprocedural laboratory examination: Secondary | ICD-10-CM | POA: Insufficient documentation

## 2019-11-25 DIAGNOSIS — Z20822 Contact with and (suspected) exposure to covid-19: Secondary | ICD-10-CM | POA: Diagnosis not present

## 2019-11-25 LAB — SARS CORONAVIRUS 2 (TAT 6-24 HRS): SARS Coronavirus 2: NEGATIVE

## 2019-11-25 NOTE — Anesthesia Preprocedure Evaluation (Addendum)
Anesthesia Evaluation  Patient identified by MRN, date of birth, ID band Patient awake    Reviewed: Allergy & Precautions, NPO status , Patient's Chart, lab work & pertinent test results  Airway Mallampati: II  TM Distance: >3 FB Neck ROM: Full    Dental  (+) Teeth Intact, Dental Advisory Given   Pulmonary    breath sounds clear to auscultation       Cardiovascular hypertension,  Rhythm:Regular Rate:Normal     Neuro/Psych    GI/Hepatic   Endo/Other  diabetes  Renal/GU      Musculoskeletal   Abdominal (+) + obese,   Peds  Hematology   Anesthesia Other Findings   Reproductive/Obstetrics                            Anesthesia Physical Anesthesia Plan  ASA: III  Anesthesia Plan: General   Post-op Pain Management:    Induction: Intravenous  PONV Risk Score and Plan: Ondansetron and Dexamethasone  Airway Management Planned: Oral ETT  Additional Equipment:   Intra-op Plan:   Post-operative Plan: Extubation in OR  Informed Consent: I have reviewed the patients History and Physical, chart, labs and discussed the procedure including the risks, benefits and alternatives for the proposed anesthesia with the patient or authorized representative who has indicated his/her understanding and acceptance.     Dental advisory given  Plan Discussed with: CRNA and Anesthesiologist  Anesthesia Plan Comments: (Note from Karoline Caldwell, PA-C: DM2 well-controlled, preop A1c 6.1. BMet is mildly abnormal with elevated potassium 5.4, mild renal insufficiency with creatinine 1.38. These results were called to Dr. Melven Sartorius office for his review. Discussed with NP in Dr. Melven Sartorius office, possible component of dehydration based on labs and would recommend pt hydrate leading up to surgery.   EKG 11/24/2019: Sinus rhythm with sinus arrhythmia. Rate 60.)     Anesthesia Quick Evaluation

## 2019-11-29 ENCOUNTER — Inpatient Hospital Stay (HOSPITAL_COMMUNITY): Payer: Medicare Other

## 2019-11-29 ENCOUNTER — Encounter (HOSPITAL_COMMUNITY): Payer: Self-pay | Admitting: Neurosurgery

## 2019-11-29 ENCOUNTER — Inpatient Hospital Stay (HOSPITAL_COMMUNITY)
Admission: RE | Disposition: A | Discharge status: Home / Self Care | Payer: Self-pay | Source: Home / Self Care | Attending: Neurosurgery

## 2019-11-29 ENCOUNTER — Inpatient Hospital Stay (HOSPITAL_COMMUNITY): Payer: Medicare Other | Admitting: Vascular Surgery

## 2019-11-29 ENCOUNTER — Inpatient Hospital Stay (HOSPITAL_COMMUNITY)
Admission: RE | Admit: 2019-11-29 | Discharge: 2019-11-30 | DRG: 454 | Disposition: A | Discharge status: Home / Self Care | Payer: Medicare Other | Attending: Neurosurgery | Admitting: Neurosurgery

## 2019-11-29 ENCOUNTER — Other Ambulatory Visit: Payer: Self-pay

## 2019-11-29 ENCOUNTER — Inpatient Hospital Stay (HOSPITAL_COMMUNITY): Payer: Medicare Other | Admitting: Certified Registered Nurse Anesthetist

## 2019-11-29 DIAGNOSIS — I10 Essential (primary) hypertension: Secondary | ICD-10-CM | POA: Diagnosis present

## 2019-11-29 DIAGNOSIS — M5117 Intervertebral disc disorders with radiculopathy, lumbosacral region: Secondary | ICD-10-CM | POA: Diagnosis present

## 2019-11-29 DIAGNOSIS — M5116 Intervertebral disc disorders with radiculopathy, lumbar region: Secondary | ICD-10-CM | POA: Diagnosis not present

## 2019-11-29 DIAGNOSIS — M4317 Spondylolisthesis, lumbosacral region: Secondary | ICD-10-CM | POA: Diagnosis present

## 2019-11-29 DIAGNOSIS — Z79899 Other long term (current) drug therapy: Secondary | ICD-10-CM | POA: Diagnosis not present

## 2019-11-29 DIAGNOSIS — M48061 Spinal stenosis, lumbar region without neurogenic claudication: Secondary | ICD-10-CM | POA: Diagnosis present

## 2019-11-29 DIAGNOSIS — Z791 Long term (current) use of non-steroidal anti-inflammatories (NSAID): Secondary | ICD-10-CM | POA: Diagnosis not present

## 2019-11-29 DIAGNOSIS — Z6841 Body Mass Index (BMI) 40.0 and over, adult: Secondary | ICD-10-CM

## 2019-11-29 DIAGNOSIS — M4807 Spinal stenosis, lumbosacral region: Secondary | ICD-10-CM | POA: Diagnosis not present

## 2019-11-29 DIAGNOSIS — Z419 Encounter for procedure for purposes other than remedying health state, unspecified: Secondary | ICD-10-CM

## 2019-11-29 DIAGNOSIS — Z7984 Long term (current) use of oral hypoglycemic drugs: Secondary | ICD-10-CM

## 2019-11-29 DIAGNOSIS — M4316 Spondylolisthesis, lumbar region: Principal | ICD-10-CM | POA: Diagnosis present

## 2019-11-29 DIAGNOSIS — E1142 Type 2 diabetes mellitus with diabetic polyneuropathy: Secondary | ICD-10-CM | POA: Diagnosis present

## 2019-11-29 DIAGNOSIS — M4326 Fusion of spine, lumbar region: Secondary | ICD-10-CM | POA: Diagnosis not present

## 2019-11-29 DIAGNOSIS — E669 Obesity, unspecified: Secondary | ICD-10-CM | POA: Diagnosis present

## 2019-11-29 DIAGNOSIS — M48062 Spinal stenosis, lumbar region with neurogenic claudication: Secondary | ICD-10-CM

## 2019-11-29 DIAGNOSIS — Z981 Arthrodesis status: Secondary | ICD-10-CM | POA: Diagnosis not present

## 2019-11-29 LAB — GLUCOSE, CAPILLARY
Glucose-Capillary: 128 mg/dL — ABNORMAL HIGH (ref 70–99)
Glucose-Capillary: 173 mg/dL — ABNORMAL HIGH (ref 70–99)
Glucose-Capillary: 214 mg/dL — ABNORMAL HIGH (ref 70–99)
Glucose-Capillary: 146 mg/dL — ABNORMAL HIGH (ref 70–99)

## 2019-11-29 LAB — POCT I-STAT, CHEM 8
BUN: 39 mg/dL — ABNORMAL HIGH (ref 8–23)
Calcium, Ion: 1.22 mmol/L (ref 1.15–1.40)
Chloride: 106 mmol/L (ref 98–111)
Creatinine, Ser: 0.9 mg/dL (ref 0.44–1.00)
Glucose, Bld: 178 mg/dL — ABNORMAL HIGH (ref 70–99)
HCT: 31 % — ABNORMAL LOW (ref 36.0–46.0)
Hemoglobin: 10.5 g/dL — ABNORMAL LOW (ref 12.0–15.0)
Potassium: 4.9 mmol/L (ref 3.5–5.1)
Sodium: 138 mmol/L (ref 135–145)
TCO2: 24 mmol/L (ref 22–32)

## 2019-11-29 LAB — ABO/RH: ABO/RH(D): O POS

## 2019-11-29 SURGERY — POSTERIOR LUMBAR FUSION 2 LEVEL
Anesthesia: General | Site: Back

## 2019-11-29 MED ORDER — METHOCARBAMOL 500 MG PO TABS
500.0000 mg | ORAL_TABLET | Freq: Four times a day (QID) | ORAL | Status: DC | PRN
  Administered 2019-11-29 – 2019-11-30 (×3): 500 mg via ORAL
  Filled 2019-11-29 (×3): qty 1

## 2019-11-29 MED ORDER — ROCURONIUM BROMIDE 100 MG/10ML IV SOLN
INTRAVENOUS | Status: DC | PRN
  Administered 2019-11-29 (×2): 10 mg via INTRAVENOUS
  Administered 2019-11-29: 60 mg via INTRAVENOUS
  Administered 2019-11-29: 20 mg via INTRAVENOUS

## 2019-11-29 MED ORDER — CHOLECALCIFEROL 100 MCG (4000 UT) PO CAPS: 4000.0000 [IU] | ORAL_CAPSULE | Freq: Every day | ORAL | Status: DC

## 2019-11-29 MED ORDER — ONDANSETRON HCL 4 MG/2ML IJ SOLN: 4.0000 mg | Freq: Once | INTRAMUSCULAR | Status: DC | PRN

## 2019-11-29 MED ORDER — CEFAZOLIN SODIUM-DEXTROSE 2-4 GM/100ML-% IV SOLN
2.0000 g | INTRAVENOUS | Status: AC
  Administered 2019-11-29: 2 g via INTRAVENOUS
  Filled 2019-11-29: qty 100

## 2019-11-29 MED ORDER — LIDOCAINE-EPINEPHRINE 1 %-1:100000 IJ SOLN
INTRAMUSCULAR | Status: DC | PRN
  Administered 2019-11-29: 5 mL

## 2019-11-29 MED ORDER — ONDANSETRON HCL 4 MG/2ML IJ SOLN
INTRAMUSCULAR | Status: AC
  Filled 2019-11-29: qty 2

## 2019-11-29 MED ORDER — ROCURONIUM BROMIDE 10 MG/ML (PF) SYRINGE
PREFILLED_SYRINGE | INTRAVENOUS | Status: AC
  Filled 2019-11-29: qty 10

## 2019-11-29 MED ORDER — ACETAMINOPHEN ER 650 MG PO TBCR: 1300.0000 mg | EXTENDED_RELEASE_TABLET | Freq: Three times a day (TID) | ORAL | Status: DC | PRN

## 2019-11-29 MED ORDER — PROPOFOL 10 MG/ML IV BOLUS
INTRAVENOUS | Status: AC
  Filled 2019-11-29: qty 20

## 2019-11-29 MED ORDER — ALPHA-LIPOIC ACID 600 MG PO CAPS: 600.0000 mg | ORAL_CAPSULE | Freq: Two times a day (BID) | ORAL | Status: DC

## 2019-11-29 MED ORDER — MENTHOL 3 MG MT LOZG: 1.0000 | LOZENGE | OROMUCOSAL | Status: DC | PRN

## 2019-11-29 MED ORDER — ACETAMINOPHEN 325 MG PO TABS
650.0000 mg | ORAL_TABLET | ORAL | Status: DC | PRN
  Administered 2019-11-29 – 2019-11-30 (×2): 650 mg via ORAL
  Filled 2019-11-29 (×2): qty 2

## 2019-11-29 MED ORDER — LOSARTAN POTASSIUM 50 MG PO TABS
25.0000 mg | ORAL_TABLET | Freq: Every day | ORAL | Status: DC
  Administered 2019-11-29: 25 mg via ORAL
  Filled 2019-11-29: qty 1

## 2019-11-29 MED ORDER — PROPOFOL 10 MG/ML IV BOLUS
INTRAVENOUS | Status: AC
  Filled 2019-11-29: qty 40

## 2019-11-29 MED ORDER — LIDOCAINE-EPINEPHRINE 1 %-1:100000 IJ SOLN
INTRAMUSCULAR | Status: AC
  Filled 2019-11-29: qty 1

## 2019-11-29 MED ORDER — ORAL CARE MOUTH RINSE: 15.0000 mL | Freq: Once | OROMUCOSAL | Status: AC

## 2019-11-29 MED ORDER — ZOLPIDEM TARTRATE 5 MG PO TABS: 5.0000 mg | ORAL_TABLET | Freq: Every evening | ORAL | Status: DC | PRN

## 2019-11-29 MED ORDER — DEXAMETHASONE SODIUM PHOSPHATE 10 MG/ML IJ SOLN
INTRAMUSCULAR | Status: AC
  Filled 2019-11-29: qty 1

## 2019-11-29 MED ORDER — CHLORHEXIDINE GLUCONATE CLOTH 2 % EX PADS: 6.0000 | MEDICATED_PAD | Freq: Once | CUTANEOUS | Status: DC

## 2019-11-29 MED ORDER — INSULIN ASPART 100 UNIT/ML ~~LOC~~ SOLN
0.0000 [IU] | Freq: Three times a day (TID) | SUBCUTANEOUS | Status: DC
  Administered 2019-11-29: 5 [IU] via SUBCUTANEOUS
  Administered 2019-11-30: 3 [IU] via SUBCUTANEOUS

## 2019-11-29 MED ORDER — FENTANYL CITRATE (PF) 250 MCG/5ML IJ SOLN
INTRAMUSCULAR | Status: AC
  Filled 2019-11-29: qty 5

## 2019-11-29 MED ORDER — FENTANYL CITRATE (PF) 100 MCG/2ML IJ SOLN
25.0000 ug | INTRAMUSCULAR | Status: DC | PRN
  Administered 2019-11-29: 25 ug via INTRAVENOUS

## 2019-11-29 MED ORDER — ONDANSETRON HCL 4 MG PO TABS: 4.0000 mg | ORAL_TABLET | Freq: Four times a day (QID) | ORAL | Status: DC | PRN

## 2019-11-29 MED ORDER — FENTANYL CITRATE (PF) 250 MCG/5ML IJ SOLN
INTRAMUSCULAR | Status: DC | PRN
Start: 2019-11-29 — End: 2019-11-29
  Administered 2019-11-29 (×6): 50 ug via INTRAVENOUS

## 2019-11-29 MED ORDER — LIDOCAINE 2% (20 MG/ML) 5 ML SYRINGE
INTRAMUSCULAR | Status: AC
  Filled 2019-11-29: qty 5

## 2019-11-29 MED ORDER — PANTOPRAZOLE SODIUM 40 MG IV SOLR
40.0000 mg | Freq: Every day | INTRAVENOUS | Status: DC
  Administered 2019-11-29: 40 mg via INTRAVENOUS
  Filled 2019-11-29: qty 40

## 2019-11-29 MED ORDER — THROMBIN 5000 UNITS EX SOLR
CUTANEOUS | Status: AC
  Filled 2019-11-29: qty 5000

## 2019-11-29 MED ORDER — FLEET ENEMA 7-19 GM/118ML RE ENEM: 1.0000 | ENEMA | Freq: Once | RECTAL | Status: DC | PRN

## 2019-11-29 MED ORDER — PHENYLEPHRINE 40 MCG/ML (10ML) SYRINGE FOR IV PUSH (FOR BLOOD PRESSURE SUPPORT)
PREFILLED_SYRINGE | INTRAVENOUS | Status: DC | PRN
  Administered 2019-11-29: 80 ug via INTRAVENOUS
  Administered 2019-11-29 (×2): 40 ug via INTRAVENOUS
  Administered 2019-11-29 (×2): 80 ug via INTRAVENOUS

## 2019-11-29 MED ORDER — BUPIVACAINE LIPOSOME 1.3 % IJ SUSP
20.0000 mL | Freq: Once | INTRAMUSCULAR | Status: DC
  Filled 2019-11-29: qty 20

## 2019-11-29 MED ORDER — SODIUM CHLORIDE 0.9% FLUSH: 3.0000 mL | INTRAVENOUS | Status: DC | PRN

## 2019-11-29 MED ORDER — DOCUSATE SODIUM 100 MG PO CAPS
100.0000 mg | ORAL_CAPSULE | Freq: Two times a day (BID) | ORAL | Status: DC
  Administered 2019-11-29 – 2019-11-30 (×2): 100 mg via ORAL
  Filled 2019-11-29 (×2): qty 1

## 2019-11-29 MED ORDER — FENTANYL CITRATE (PF) 100 MCG/2ML IJ SOLN
INTRAMUSCULAR | Status: AC
Start: 2019-11-29 — End: 2019-11-30
  Filled 2019-11-29: qty 2

## 2019-11-29 MED ORDER — 0.9 % SODIUM CHLORIDE (POUR BTL) OPTIME
TOPICAL | Status: DC | PRN
  Administered 2019-11-29: 1000 mL

## 2019-11-29 MED ORDER — BUPIVACAINE LIPOSOME 1.3 % IJ SUSP
INTRAMUSCULAR | Status: DC | PRN
  Administered 2019-11-29: 20 mL

## 2019-11-29 MED ORDER — SODIUM CHLORIDE 0.9 % IV SOLN
250.0000 mL | INTRAVENOUS | Status: DC
  Administered 2019-11-29: 250 mL via INTRAVENOUS

## 2019-11-29 MED ORDER — LIDOCAINE 2% (20 MG/ML) 5 ML SYRINGE
INTRAMUSCULAR | Status: DC | PRN
  Administered 2019-11-29: 40 mg via INTRAVENOUS

## 2019-11-29 MED ORDER — SUGAMMADEX SODIUM 500 MG/5ML IV SOLN
INTRAVENOUS | Status: AC
  Filled 2019-11-29: qty 5

## 2019-11-29 MED ORDER — ALBUMIN HUMAN 25 % IV SOLN: INTRAVENOUS | Status: DC | PRN

## 2019-11-29 MED ORDER — THROMBIN 20000 UNITS EX SOLR
CUTANEOUS | Status: AC
  Filled 2019-11-29: qty 20000

## 2019-11-29 MED ORDER — PROPOFOL 10 MG/ML IV BOLUS
INTRAVENOUS | Status: DC | PRN
  Administered 2019-11-29: 140 mg via INTRAVENOUS

## 2019-11-29 MED ORDER — ACETAMINOPHEN 650 MG RE SUPP: 650.0000 mg | RECTAL | Status: DC | PRN

## 2019-11-29 MED ORDER — ACETAMINOPHEN 10 MG/ML IV SOLN
INTRAVENOUS | Status: AC
  Filled 2019-11-29: qty 100

## 2019-11-29 MED ORDER — ONDANSETRON HCL 4 MG/2ML IJ SOLN
INTRAMUSCULAR | Status: DC | PRN
  Administered 2019-11-29: 4 mg via INTRAVENOUS

## 2019-11-29 MED ORDER — BUPIVACAINE HCL (PF) 0.5 % IJ SOLN
INTRAMUSCULAR | Status: AC
  Filled 2019-11-29: qty 30

## 2019-11-29 MED ORDER — PRAVASTATIN SODIUM 10 MG PO TABS
20.0000 mg | ORAL_TABLET | Freq: Every day | ORAL | Status: DC
  Administered 2019-11-29: 20 mg via ORAL
  Filled 2019-11-29: qty 2

## 2019-11-29 MED ORDER — MIDAZOLAM HCL 5 MG/5ML IJ SOLN
INTRAMUSCULAR | Status: DC | PRN
  Administered 2019-11-29 (×2): .5 mg via INTRAVENOUS
  Administered 2019-11-29: 1 mg via INTRAVENOUS

## 2019-11-29 MED ORDER — OXYCODONE HCL 5 MG PO TABS: 5.0000 mg | ORAL_TABLET | Freq: Once | ORAL | Status: DC | PRN

## 2019-11-29 MED ORDER — SUGAMMADEX SODIUM 200 MG/2ML IV SOLN
INTRAVENOUS | Status: DC | PRN
  Administered 2019-11-29: 200 mg via INTRAVENOUS
  Administered 2019-11-29: 150 mg via INTRAVENOUS

## 2019-11-29 MED ORDER — DEXAMETHASONE SODIUM PHOSPHATE 10 MG/ML IJ SOLN
INTRAMUSCULAR | Status: DC | PRN
  Administered 2019-11-29: 5 mg via INTRAVENOUS

## 2019-11-29 MED ORDER — ONDANSETRON HCL 4 MG/2ML IJ SOLN: 4.0000 mg | Freq: Four times a day (QID) | INTRAMUSCULAR | Status: DC | PRN

## 2019-11-29 MED ORDER — SODIUM CHLORIDE 0.9% FLUSH
3.0000 mL | Freq: Two times a day (BID) | INTRAVENOUS | Status: DC
  Administered 2019-11-29 (×2): 3 mL via INTRAVENOUS

## 2019-11-29 MED ORDER — THROMBIN 20000 UNITS EX SOLR
CUTANEOUS | Status: DC | PRN
  Administered 2019-11-29: 20 mL via TOPICAL

## 2019-11-29 MED ORDER — PHENOL 1.4 % MT LIQD: 1.0000 | OROMUCOSAL | Status: DC | PRN

## 2019-11-29 MED ORDER — LACTATED RINGERS IV SOLN: INTRAVENOUS | Status: DC | PRN

## 2019-11-29 MED ORDER — MIDAZOLAM HCL 2 MG/2ML IJ SOLN
INTRAMUSCULAR | Status: AC
  Filled 2019-11-29: qty 2

## 2019-11-29 MED ORDER — INSULIN ASPART 100 UNIT/ML ~~LOC~~ SOLN
4.0000 [IU] | Freq: Three times a day (TID) | SUBCUTANEOUS | Status: DC
  Administered 2019-11-29 – 2019-11-30 (×2): 4 [IU] via SUBCUTANEOUS

## 2019-11-29 MED ORDER — ALUM & MAG HYDROXIDE-SIMETH 200-200-20 MG/5ML PO SUSP: 30.0000 mL | Freq: Four times a day (QID) | ORAL | Status: DC | PRN

## 2019-11-29 MED ORDER — BISACODYL 10 MG RE SUPP: 10.0000 mg | Freq: Every day | RECTAL | Status: DC | PRN

## 2019-11-29 MED ORDER — POLYETHYLENE GLYCOL 3350 17 G PO PACK: 17.0000 g | PACK | Freq: Every day | ORAL | Status: DC | PRN

## 2019-11-29 MED ORDER — BUPIVACAINE HCL (PF) 0.5 % IJ SOLN
INTRAMUSCULAR | Status: DC | PRN
  Administered 2019-11-29: 5 mL

## 2019-11-29 MED ORDER — HYDROMORPHONE HCL 1 MG/ML IJ SOLN
0.5000 mg | INTRAMUSCULAR | Status: DC | PRN
  Administered 2019-11-29: 0.5 mg via INTRAVENOUS
  Filled 2019-11-29: qty 0.5

## 2019-11-29 MED ORDER — THROMBIN 5000 UNITS EX SOLR
OROMUCOSAL | Status: DC | PRN
  Administered 2019-11-29 (×2): 5 mL via TOPICAL

## 2019-11-29 MED ORDER — ACETAMINOPHEN 10 MG/ML IV SOLN
INTRAVENOUS | Status: DC | PRN
  Administered 2019-11-29: 1000 mg via INTRAVENOUS

## 2019-11-29 MED ORDER — KCL IN DEXTROSE-NACL 10-5-0.45 MEQ/L-%-% IV SOLN
INTRAVENOUS | Status: DC
  Filled 2019-11-29: qty 1000

## 2019-11-29 MED ORDER — OXYCODONE HCL 5 MG/5ML PO SOLN: 5.0000 mg | Freq: Once | ORAL | Status: DC | PRN

## 2019-11-29 MED ORDER — HYDROCODONE-ACETAMINOPHEN 5-325 MG PO TABS: 1.0000 | ORAL_TABLET | ORAL | Status: DC | PRN

## 2019-11-29 MED ORDER — METFORMIN HCL 500 MG PO TABS
500.0000 mg | ORAL_TABLET | Freq: Every day | ORAL | Status: DC
  Administered 2019-11-30: 500 mg via ORAL
  Filled 2019-11-29: qty 1

## 2019-11-29 MED ORDER — INSULIN ASPART 100 UNIT/ML ~~LOC~~ SOLN: 0.0000 [IU] | Freq: Every day | SUBCUTANEOUS | Status: DC

## 2019-11-29 MED ORDER — CHLORHEXIDINE GLUCONATE 0.12 % MT SOLN
15.0000 mL | Freq: Once | OROMUCOSAL | Status: AC
  Administered 2019-11-29: 15 mL via OROMUCOSAL
  Filled 2019-11-29: qty 15

## 2019-11-29 MED ORDER — METHOCARBAMOL 1000 MG/10ML IJ SOLN
500.0000 mg | Freq: Four times a day (QID) | INTRAVENOUS | Status: DC | PRN
  Filled 2019-11-29: qty 5

## 2019-11-29 MED ORDER — CEFAZOLIN SODIUM-DEXTROSE 2-4 GM/100ML-% IV SOLN
2.0000 g | Freq: Three times a day (TID) | INTRAVENOUS | Status: AC
  Administered 2019-11-29 (×2): 2 g via INTRAVENOUS
  Filled 2019-11-29 (×2): qty 100

## 2019-11-29 MED ORDER — LACTATED RINGERS IV SOLN: INTRAVENOUS | Status: DC

## 2019-11-29 MED ORDER — PHENYLEPHRINE HCL-NACL 10-0.9 MG/250ML-% IV SOLN
INTRAVENOUS | Status: DC | PRN
  Administered 2019-11-29: 40 ug/min via INTRAVENOUS

## 2019-11-29 MED ORDER — OXYCODONE HCL 5 MG PO TABS
5.0000 mg | ORAL_TABLET | ORAL | Status: DC | PRN
  Administered 2019-11-29 – 2019-11-30 (×5): 10 mg via ORAL
  Filled 2019-11-29 (×5): qty 2

## 2019-11-29 SURGICAL SUPPLY — 77 items
ADH SKN CLS APL DERMABOND .7 (GAUZE/BANDAGES/DRESSINGS) ×1
BASKET BONE COLLECTION (BASKET) ×3 IMPLANT
BLADE CLIPPER SURG (BLADE) IMPLANT
BUR MATCHSTICK NEURO 3.0 LAGG (BURR) ×3 IMPLANT
BUR PRECISION FLUTE 5.0 (BURR) ×3 IMPLANT
CAGE PLIF 8X9X23-12 LUMBAR (Cage) ×8 IMPLANT
CANISTER SUCT 3000ML PPV (MISCELLANEOUS) ×3 IMPLANT
CARTRIDGE OIL MAESTRO DRILL (MISCELLANEOUS) ×1 IMPLANT
CNTNR URN SCR LID CUP LEK RST (MISCELLANEOUS) ×1 IMPLANT
CONT SPEC 4OZ STRL OR WHT (MISCELLANEOUS) ×3
COVER BACK TABLE 60X90IN (DRAPES) ×3 IMPLANT
COVER WAND RF STERILE (DRAPES) ×3 IMPLANT
DECANTER SPIKE VIAL GLASS SM (MISCELLANEOUS) ×3 IMPLANT
DERMABOND ADVANCED (GAUZE/BANDAGES/DRESSINGS) ×2
DERMABOND ADVANCED .7 DNX12 (GAUZE/BANDAGES/DRESSINGS) ×1 IMPLANT
DIFFUSER DRILL AIR PNEUMATIC (MISCELLANEOUS) ×3 IMPLANT
DRAPE C-ARM 42X72 X-RAY (DRAPES) ×3 IMPLANT
DRAPE C-ARMOR (DRAPES) ×3 IMPLANT
DRAPE LAPAROTOMY 100X72X124 (DRAPES) ×3 IMPLANT
DRAPE SURG 17X23 STRL (DRAPES) ×3 IMPLANT
DRSG OPSITE POSTOP 4X8 (GAUZE/BANDAGES/DRESSINGS) ×2 IMPLANT
DURAPREP 26ML APPLICATOR (WOUND CARE) ×3 IMPLANT
ELECT REM PT RETURN 9FT ADLT (ELECTROSURGICAL) ×3
ELECTRODE REM PT RTRN 9FT ADLT (ELECTROSURGICAL) ×1 IMPLANT
EVACUATOR 1/8 PVC DRAIN (DRAIN) IMPLANT
GAUZE 4X4 16PLY RFD (DISPOSABLE) IMPLANT
GAUZE SPONGE 4X4 12PLY STRL (GAUZE/BANDAGES/DRESSINGS) ×1 IMPLANT
GLOVE BIO SURGEON STRL SZ8 (GLOVE) ×6 IMPLANT
GLOVE BIOGEL PI IND STRL 6.5 (GLOVE) IMPLANT
GLOVE BIOGEL PI IND STRL 7.0 (GLOVE) IMPLANT
GLOVE BIOGEL PI IND STRL 8 (GLOVE) ×2 IMPLANT
GLOVE BIOGEL PI IND STRL 8.5 (GLOVE) ×2 IMPLANT
GLOVE BIOGEL PI INDICATOR 6.5 (GLOVE) ×4
GLOVE BIOGEL PI INDICATOR 7.0 (GLOVE) ×4
GLOVE BIOGEL PI INDICATOR 8 (GLOVE) ×4
GLOVE BIOGEL PI INDICATOR 8.5 (GLOVE) ×4
GLOVE ECLIPSE 8.0 STRL XLNG CF (GLOVE) ×6 IMPLANT
GLOVE EXAM NITRILE XL STR (GLOVE) IMPLANT
GLOVE SURG SS PI 6.0 STRL IVOR (GLOVE) ×10 IMPLANT
GOWN STRL REUS W/ TWL LRG LVL3 (GOWN DISPOSABLE) IMPLANT
GOWN STRL REUS W/ TWL XL LVL3 (GOWN DISPOSABLE) ×3 IMPLANT
GOWN STRL REUS W/TWL 2XL LVL3 (GOWN DISPOSABLE) ×2 IMPLANT
GOWN STRL REUS W/TWL LRG LVL3 (GOWN DISPOSABLE) ×6
GOWN STRL REUS W/TWL XL LVL3 (GOWN DISPOSABLE) ×9
HEMOSTAT POWDER KIT SURGIFOAM (HEMOSTASIS) ×5 IMPLANT
KIT BASIN OR (CUSTOM PROCEDURE TRAY) ×3 IMPLANT
KIT POSITION SURG JACKSON T1 (MISCELLANEOUS) ×3 IMPLANT
KIT TURNOVER KIT B (KITS) ×3 IMPLANT
MILL MEDIUM DISP (BLADE) ×2 IMPLANT
NDL HYPO 21X1.5 SAFETY (NEEDLE) IMPLANT
NDL HYPO 25X1 1.5 SAFETY (NEEDLE) ×1 IMPLANT
NDL SPNL 18GX3.5 QUINCKE PK (NEEDLE) IMPLANT
NEEDLE HYPO 21X1.5 SAFETY (NEEDLE) ×3 IMPLANT
NEEDLE HYPO 25X1 1.5 SAFETY (NEEDLE) ×3 IMPLANT
NEEDLE SPNL 18GX3.5 QUINCKE PK (NEEDLE) IMPLANT
NS IRRIG 1000ML POUR BTL (IV SOLUTION) ×3 IMPLANT
OIL CARTRIDGE MAESTRO DRILL (MISCELLANEOUS) ×3
PACK LAMINECTOMY NEURO (CUSTOM PROCEDURE TRAY) ×3 IMPLANT
PAD ARMBOARD 7.5X6 YLW CONV (MISCELLANEOUS) ×9 IMPLANT
PATTIES SURGICAL .5 X.5 (GAUZE/BANDAGES/DRESSINGS) IMPLANT
PATTIES SURGICAL .5 X1 (DISPOSABLE) IMPLANT
PATTIES SURGICAL 1X1 (DISPOSABLE) IMPLANT
ROD RELINE TI LORD 5.5X70 (Rod) ×4 IMPLANT
SCREW LOCK RELINE 5.5 TULIP (Screw) ×12 IMPLANT
SCREW RELINE-O POLY 6.5X45 (Screw) ×12 IMPLANT
SPONGE LAP 4X18 RFD (DISPOSABLE) IMPLANT
SPONGE SURGIFOAM ABS GEL 100 (HEMOSTASIS) ×2 IMPLANT
STAPLER SKIN PROX WIDE 3.9 (STAPLE) ×2 IMPLANT
SUT VIC AB 1 CT1 18XBRD ANBCTR (SUTURE) ×2 IMPLANT
SUT VIC AB 1 CT1 8-18 (SUTURE) ×6
SUT VIC AB 2-0 CT1 18 (SUTURE) ×6 IMPLANT
SUT VIC AB 3-0 SH 8-18 (SUTURE) ×6 IMPLANT
SYR 5ML LL (SYRINGE) IMPLANT
TOWEL GREEN STERILE (TOWEL DISPOSABLE) ×3 IMPLANT
TOWEL GREEN STERILE FF (TOWEL DISPOSABLE) ×3 IMPLANT
TRAY FOLEY MTR SLVR 16FR STAT (SET/KITS/TRAYS/PACK) ×3 IMPLANT
WATER STERILE IRR 1000ML POUR (IV SOLUTION) ×3 IMPLANT

## 2019-11-29 NOTE — Anesthesia Postprocedure Evaluation (Signed)
Anesthesia Post Note  Patient: Paige Phillips  Procedure(s) Performed: LUMBAR FOUR- LUMBAR FIVE, LUMBAR FIVE- SACRAL ONE POSTERIOR LUMBAR INTERBODY FUSION (N/A Back)     Patient location during evaluation: PACU Anesthesia Type: General Level of consciousness: awake and alert Pain management: pain level controlled Vital Signs Assessment: post-procedure vital signs reviewed and stable Respiratory status: spontaneous breathing, nonlabored ventilation, respiratory function stable and patient connected to nasal cannula oxygen Cardiovascular status: blood pressure returned to baseline and stable Postop Assessment: no apparent nausea or vomiting Anesthetic complications: no   No complications documented.  Last Vitals:  Vitals:   11/29/19 1337 11/29/19 1638  BP: (!) 158/74 (!) 144/60  Pulse: 91 79  Resp: 16 18  Temp: 36.4 C 36.5 C  SpO2: 100% 98%    Last Pain:  Vitals:   11/29/19 1638  TempSrc: Oral  PainSc:                  Tyriana Helmkamp COKER

## 2019-11-29 NOTE — Brief Op Note (Signed)
11/29/2019  11:15 AM  PATIENT:  Paige Phillips  69 y.o. female  PRE-OPERATIVE DIAGNOSIS:  SPONDYLOLISTHESIS, LUMBAR REGION, degenerative lumbar spinal stenosis, herniated lumbar disc, lumbago, radiculopathy L 45 and L 5 S 1 levels  POST-OPERATIVE DIAGNOSIS: SPONDYLOLISTHESIS, LUMBAR REGION, degenerative lumbar spinal stenosis, herniated lumbar disc, lumbago, radiculopathy L 45 and L 5 S 1 levels   PROCEDURE:  Procedure(s): LUMBAR FOUR- LUMBAR FIVE, LUMBAR FIVE- SACRAL ONE POSTERIOR LUMBAR INTERBODY FUSION (N/A) with pedicle screw fixation and posterolateral arthrodesis   SURGEON:  Surgeon(s) and Role:    Erline Levine, MD - Primary  PHYSICIAN ASSISTANT: Glenford Peers, NP  ASSISTANTS: Poteat, RN   Errico, MS 3   ANESTHESIA:   general  EBL:  300 mL   BLOOD ADMINISTERED:none  DRAINS: none   LOCAL MEDICATIONS USED:  MARCAINE    and LIDOCAINE   SPECIMEN:  No Specimen  DISPOSITION OF SPECIMEN:  N/A  COUNTS:  YES  TOURNIQUET:  * No tourniquets in log *  DICTATION: Patient is 69 year old man with  HNP, spondylosis, mobile spondylolisthesis, stenosis, DDD, radiculopathy L4/5, L5/S1. She has a severe bilateral leg pain and weakness. It was elected to take her to surgery for decompression and fusion at L4/5 and L5/S1 levels.    Procedure: Patient was placed in a prone position on the [place] table after smooth and uncomplicated induction of general endotracheal anesthesia. His low back was prepped and draped in usual sterile fashion with betadine scrub and DuraPrep. Area of incision was infiltrated with local lidocaine. Incision was made to the lumbodorsal fascia was incised and exposure was performed of the L5-S1 spinous processes laminae facet joint and transverse processes. Intraoperative x-ray was obtained which confirmed correct orientation. A total laminectomy of L4 and L5 was performed with disarticulation of the facet joints at this level and thorough decompression was  performed of both L4, L5 and S1 nerve roots along with the common dural tube. There was densely adherent spondylytic material compressing the thecal sac and both L4 and L5 nerve roots.  A thorough discectomy and preparation of the endplates was performed at both the L4/5 and L5/S1 levels.  After a thorough decompression with bilateral discectomy at L5 / S1 , bilateral  8 x 9 x 23 mm 12 degree peek cages were packed with local autograft and was inserted the interspace and countersunk appropriately. 10 cc local autograft was packed in the interspace medial to the second cage.   After a thorough decompression with bilateral discectomy at L 4 5 , bilateral  8 x 9 x 23 mm 12 degree peek cages were packed with local autograft and was inserted the interspace and countersunk appropriately. 10 cc local autograft was packed in the interspace medial to the second cage. The posterolateral region was extensively decorticated and pedicle probes were placed at L4, L5 and S1 bilaterally. Intraoperative fluoroscopy confirmed correct orientationin the AP and lateral plane. 45 x 6.5 mm pedicle screws were placed at S1 bilaterally and 45 x 6.5 mm screws placed at L5 bilaterally and  45 x 6.67mm screws were placed at L4 bilaterally.  Final x-rays demonstrated well-positioned interbody grafts and pedicle screw fixation. 70 mm lordotic rods were placed and locked down in situ and the posterolateral region was packed with the remaining bone autograft (20 cc on each side). Incision was infiltrated 20 cc of long-acting Marcaine was used in the posterolateral soft tissue. Fascia was closed with 1 Vicryl sutures skin edges were reapproximated 2 and 3-0 Vicryl sutures.  The wound is dressed with Dermabond and an occlusive dressing.   the patient was extubated in the operating room and taken to recovery in stable satisfactory condition she tolerated the operation well. counts were correct at the end of the case.   PLAN OF CARE: Admit to  inpatient   PATIENT DISPOSITION:  PACU - hemodynamically stable.   Delay start of Pharmacological VTE agent (>24hrs) due to surgical blood loss or risk of bleeding: yes

## 2019-11-29 NOTE — Progress Notes (Signed)
Awake, Alert, conversant.  Full strength both lower extremities.  Doing well.

## 2019-11-29 NOTE — Transfer of Care (Signed)
Immediate Anesthesia Transfer of Care Note  Patient: Kerriann Kamphuis [place]sebrock  Procedure(s) Performed: LUMBAR FOUR- LUMBAR FIVE, LUMBAR FIVE- SACRAL ONE POSTERIOR LUMBAR INTERBODY FUSION (N/A Back)  Patient Location: PACU  Anesthesia Type:General  Level of Consciousness: drowsy  Airway & Oxygen Therapy: Patient Spontanous Breathing and Patient connected to face mask oxygen  Post-op Assessment: Report given to RN and Post -op Vital signs reviewed and stable  Post vital signs: Reviewed  Last Vitals:  Vitals Value Taken Time  BP 139/71 11/29/19 1124  Temp    Pulse 89 11/29/19 1131  Resp 19 11/29/19 1131  SpO2 100 % 11/29/19 1131  Vitals shown include unvalidated device data.  Last Pain:  Vitals:   11/29/19 0628  PainSc: 4       Patients Stated Pain Goal: 2 (87/19/59 7471)  Complications: No complications documented.

## 2019-11-29 NOTE — Progress Notes (Signed)
Orthopedic Tech Progress Note Patient Details:  Paige Phillips 01/13/1951 [id number] Patient already has brace per RN. Patient ID: Raneen Jaffer, female   DOB: 10-Jan-1951, 69 y.o.   MRN: [id number]   Petra Kuba 11/29/2019, 1:55 PM

## 2019-11-29 NOTE — Op Note (Signed)
11/29/2019  11:15 AM  PATIENT:  Paige Phillips  69 y.o. female  PRE-OPERATIVE DIAGNOSIS:  SPONDYLOLISTHESIS, LUMBAR REGION, degenerative lumbar spinal stenosis, herniated lumbar disc, lumbago, radiculopathy L 45 and L 5 S 1 levels  POST-OPERATIVE DIAGNOSIS: SPONDYLOLISTHESIS, LUMBAR REGION, degenerative lumbar spinal stenosis, herniated lumbar disc, lumbago, radiculopathy L 45 and L 5 S 1 levels   PROCEDURE:  Procedure(s): LUMBAR FOUR- LUMBAR FIVE, LUMBAR FIVE- SACRAL ONE POSTERIOR LUMBAR INTERBODY FUSION (N/A) with pedicle screw fixation and posterolateral arthrodesis   SURGEON:  Surgeon(s) and Role:    Erline Levine, MD - Primary  PHYSICIAN ASSISTANT: Glenford Peers, NP  ASSISTANTS: Poteat, RN   Errico, MS 3   ANESTHESIA:   general  EBL:  300 mL   BLOOD ADMINISTERED:none  DRAINS: none   LOCAL MEDICATIONS USED:  MARCAINE    and LIDOCAINE   SPECIMEN:  No Specimen  DISPOSITION OF SPECIMEN:  N/A  COUNTS:  YES  TOURNIQUET:  * No tourniquets in log *  DICTATION: Patient is 69 year old man with  HNP, spondylosis, mobile spondylolisthesis, stenosis, DDD, radiculopathy L4/5, L5/S1. She has a severe bilateral leg pain and weakness. It was elected to take her to surgery for decompression and fusion at L4/5 and L5/S1 levels.    Procedure: Patient was placed in a prone position on the [place] table after smooth and uncomplicated induction of general endotracheal anesthesia. His low back was prepped and draped in usual sterile fashion with betadine scrub and DuraPrep. Area of incision was infiltrated with local lidocaine. Incision was made to the lumbodorsal fascia was incised and exposure was performed of the L5-S1 spinous processes laminae facet joint and transverse processes. Intraoperative x-ray was obtained which confirmed correct orientation. A total laminectomy of L4 and L5 was performed with disarticulation of the facet joints at this level and thorough decompression was  performed of both L4, L5 and S1 nerve roots along with the common dural tube. There was densely adherent spondylytic material compressing the thecal sac and both L4 and L5 nerve roots.  A thorough discectomy and preparation of the endplates was performed at both the L4/5 and L5/S1 levels.  After a thorough decompression with bilateral discectomy at L5 / S1 , bilateral  8 x 9 x 23 mm 12 degree peek cages were packed with local autograft and was inserted the interspace and countersunk appropriately. 10 cc local autograft was packed in the interspace medial to the second cage.   After a thorough decompression with bilateral discectomy at L 4 5 , bilateral  8 x 9 x 23 mm 12 degree peek cages were packed with local autograft and was inserted the interspace and countersunk appropriately. 10 cc local autograft was packed in the interspace medial to the second cage. The posterolateral region was extensively decorticated and pedicle probes were placed at L4, L5 and S1 bilaterally. Intraoperative fluoroscopy confirmed correct orientationin the AP and lateral plane. 45 x 6.5 mm pedicle screws were placed at S1 bilaterally and 45 x 6.5 mm screws placed at L5 bilaterally and  45 x 6.46mm screws were placed at L4 bilaterally.  Final x-rays demonstrated well-positioned interbody grafts and pedicle screw fixation. 70 mm lordotic rods were placed and locked down in situ and the posterolateral region was packed with the remaining bone autograft (20 cc on each side). Incision was infiltrated 20 cc of long-acting Marcaine was used in the posterolateral soft tissue. Fascia was closed with 1 Vicryl sutures skin edges were reapproximated 2 and 3-0 Vicryl sutures.  The wound is dressed with Dermabond and an occlusive dressing.   the patient was extubated in the operating room and taken to recovery in stable satisfactory condition she tolerated the operation well. counts were correct at the end of the case.   PLAN OF CARE: Admit to  inpatient   PATIENT DISPOSITION:  PACU - hemodynamically stable.   Delay start of Pharmacological VTE agent (>24hrs) due to surgical blood loss or risk of bleeding: yes

## 2019-11-29 NOTE — H&P (Signed)
Patient ID:   [id number] Patient: Paige Phillips  Date of Birth: 1951-03-09 Visit Type: Office Visit   Date: 10/07/2019 10:30 AM Provider: Marchia Meiers. Vertell Limber MD   This 70 year old female presents for MRI Review.  HISTORY OF PRESENT ILLNESS: 1.  MRI Review  The patient comes back today to review her lumbar MRI.  She is already scheduled for L4-5 and L5-S1 decompression and fusion surgery.  This is scheduled for 11/29/2019.  Her new MRI redemonstrates the pathology at the L4-5 and L5-S1 levels and shows that she has not developed progressive stenosis or other degenerative changes at any other level in the lumbar spine.  There are some mild degenerative changes at the L3-4 level but this is not severely affected.  I have therefore recommended proceeding with our previous plan of decompression and fusion L4-5 and L5-S1 levels.  I had a lengthy discussion with the patient about treatment options and the need for weight control after surgery.  We went over the details of the surgery as well as expected risks and benefits in detail today.      Medical/Surgical/Interim History Reviewed, no change.  Last detailed document date:03/28/2019.     PAST MEDICAL HISTORY, SURGICAL HISTORY, FAMILY HISTORY, SOCIAL HISTORY AND REVIEW OF SYSTEMS I have reviewed the patient's past medical, surgical, family and social history as well as the comprehensive review of systems as included on the [facility] history form dated 03/28/2019, which I have signed.  Family History: Reviewed, no changes.  Last detailed document date:03/28/2019.   Social History: Reviewed, no changes. Last detailed document date: 03/28/2019.    MEDICATIONS: (added, continued or stopped this visit) Started Medication Directions Instruction Stopped  alpha lipoic acid 600 mg tablet     cholecalciferol (vitamin D3) 125 mcg (5,000 unit) capsule     Lasix 20 mg tablet take 1 tablet by oral route   every day    losartan 25 mg tablet take 1 tablet by oral route  every day    metformin 500 mg tablet take 1 tablet by oral route 2 times every day with morning and evening meals    naproxen sodium 220 mg capsule     pravastatin 20 mg tablet take 1 tablet by oral route  every day      ALLERGIES: Ingredient Reaction Medication Name Comment NO KNOWN ALLERGIES    No known allergies. Reviewed, no changes.    PHYSICAL EXAM:  Vitals Date Temp F BP Pulse Ht In Wt Lb BMI BSA Pain Score 10/07/2019  163/73 79 64 242.2 41.57  6/10     IMPRESSION:  Persistent spondylolisthesis and severe spinal stenosis at the L4-5 and L5-S1 levels  PLAN: Proceed with previously scheduled decompression and fusion surgery at the L4-5 and L5-S1 levels on 11/29/2019  Orders: Instruction(s)/Education: Assessment Instruction I10 Lifestyle education Z68.41 Dietary management education, guidance, and counseling  Completed Orders (this encounter) Order Details Reason Side Interpretation Result Initial Treatment Date Region Lifestyle education Patient will follow up with Primary Care Physician.       Dietary management education, guidance, and counseling Encouraged patient to eat well balanced diet.        Assessment/Plan  # Detail Type Description  1. Assessment Spondylolisthesis, lumbar region (M43.16).     2. Assessment Lumbar radiculopathy (M54.16).     3. 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     4. Assessment Low back pain, unspecified back pain laterality, with sciatica presence unspecified (M54.5).  5. Assessment Essential (primary) hypertension (I10).     6. Assessment Body mass index (BMI) 40.0-44.9, adult (Z68.41).  Plan Orders Today's instructions / counseling include(s) Dietary management education, guidance, and counseling. Clinical information/comments: Encouraged patient  to eat well balanced diet.       Pain Management Plan Pain Scale: 6/10. Method: Numeric Pain Intensity Scale. Location: back. Onset: 03/28/2019. Duration: varies. Quality: discomforting. Pain management follow-up plan of care: Patient will continue medication management..              Provider:  Marchia Meiers. Vertell Limber MD  10/08/2019 10:53 AM    Dictation edited by: Marchia Meiers. Vertell Limber    CC Providers: Karlyn Agee [facility] [address] [place],  [place]  [id number]-   Tracy McLean Scocuzza  [facility] [address] [place], [place] [address]-               Electronically signed by Marchia Meiers. Vertell Limber MD on 10/08/2019 10:53 AM

## 2019-11-29 NOTE — Anesthesia Procedure Notes (Signed)
Procedure Name: Intubation Date/Time: 11/29/2019 7:40 AM Performed by: Janene Harvey, CRNA Pre-anesthesia Checklist: Patient identified, Emergency Drugs available, Suction available and Patient being monitored Patient Re-evaluated:Patient Re-evaluated prior to induction Oxygen Delivery Method: Circle system utilized Preoxygenation: Pre-oxygenation with 100% oxygen Induction Type: IV induction Ventilation: Oral airway inserted - appropriate to patient size and Two handed mask ventilation required Laryngoscope Size: Mac and 4 Grade View: Grade I Tube type: Oral Tube size: 7.0 mm Number of attempts: 1 Airway Equipment and Method: Stylet and Oral airway Placement Confirmation: ETT inserted through vocal cords under direct vision,  positive ETCO2 and breath sounds checked- equal and bilateral Secured at: 21 cm Tube secured with: Tape Dental Injury: Teeth and Oropharynx as per pre-operative assessment

## 2019-11-29 NOTE — Interval H&P Note (Signed)
History and Physical Interval Note:  11/29/2019 7:32 AM  Paige Phillips  has presented today for surgery, with the diagnosis of SPONDYLOLISTHESIS, LUMBAR REGION.  The various methods of treatment have been discussed with the patient and family. After consideration of risks, benefits and other options for treatment, the patient has consented to  Procedure(s) with comments: LUMBAR 4- LUMBAR 5, LUMBAR 5- SACRAL 1 POSTERIOR LUMBAR INTERBODY FUSION (N/A) - 3C as a surgical intervention.  The patient's history has been reviewed, patient examined, no change in status, stable for surgery.  I have reviewed the patient's chart and labs.  Questions were answered to the patient's satisfaction.     Peggyann Shoals

## 2019-11-30 LAB — GLUCOSE, CAPILLARY: Glucose-Capillary: 153 mg/dL — ABNORMAL HIGH (ref 70–99)

## 2019-11-30 MED ORDER — PANTOPRAZOLE SODIUM 40 MG PO TBEC: 40.0000 mg | DELAYED_RELEASE_TABLET | Freq: Every day | ORAL | Status: DC

## 2019-11-30 MED ORDER — HYDROXYZINE HCL 50 MG/ML IM SOLN
50.0000 mg | Freq: Four times a day (QID) | INTRAMUSCULAR | Status: DC | PRN
  Administered 2019-11-30: 50 mg via INTRAMUSCULAR
  Filled 2019-11-30: qty 1

## 2019-11-30 NOTE — Progress Notes (Signed)
CSW received consult for outpatient therapy. CSW [facility] on [address].   Izzac Rockett LCSW

## 2019-11-30 NOTE — Discharge Summary (Signed)
Physician Discharge Summary  Patient ID: Paige Phillips MRN: [id number] DOB/AGE: [birth year] 69 y.o.  Admit date: 11/29/2019 Discharge date: 11/30/2019  Admission Diagnoses: SPONDYLOLISTHESIS, LUMBAR REGION, degenerative lumbar spinal stenosis, herniated lumbar disc, lumbago, radiculopathy L 45 and L 5 S 1 levels  Discharge Diagnoses: SPONDYLOLISTHESIS, [place], degenerative lumbar spinal stenosis, herniated lumbar disc, lumbago, radiculopathy L 45 and L 5 S 1 levels  Active Problems:   Spondylolisthesis of lumbar region   Discharged Condition: good  Hospital Course: The patient was admitted on 11/29/2019 and taken to the operating room where the patient underwent decompression and fusion. The patient tolerated the procedure well and was taken to the recovery room and then to the floor in stable condition. The hospital course was routine. There were no complications. The wound remained clean dry and intact. Pt had appropriate back soreness. No complaints of leg pain or new N/T/W. The patient remained afebrile with stable vital signs, and tolerated a regular diet. The patient continued to increase activities, and pain was well controlled with oral pain medications.   Consults: None  Significant Diagnostic Studies: radiology: X-Ray  Treatments: surgery: LUMBAR FOUR- LUMBAR FIVE, LUMBAR FIVE- SACRAL ONE POSTERIOR LUMBAR INTERBODY FUSION (N/A) with pedicle screw fixation and posterolateral arthrodesis  Discharge Exam: Blood pressure (!) 114/46, pulse 89, temperature 98.1 F (36.7 C), temperature source Oral, resp. rate 20, height 5\' 4"  (1.626 m), weight 108.5 kg, SpO2 90 %.  Physical Exam:  Awake,A/O X 4, and conversant. Patient is doing welloverall and is in good spirits. MAEW with good strength that is symmetric bilaterally.  Dressing is clean dry intact.  Incision is well approximated with no drainage, erythema, or edema.  Disposition: Discharge disposition: 01-Home or Self  Care        Allergies as of 11/30/2019   No Known Allergies     Medication List    TAKE these medications   acetaminophen 650 MG CR tablet Commonly known as: TYLENOL Take 1,300 mg by mouth every 8 (eight) hours as needed for pain.   Alpha-Lipoic Acid 600 MG Caps Take 600 mg by mouth 2 (two) times daily.   HM Vitamin D3 100 MCG (4000 UT) Caps Generic drug: Cholecalciferol Take 4,000 Units by mouth daily.   losartan 25 MG tablet Commonly known as: Cozaar Take 1 tablet (25 mg total) by mouth daily.   metFORMIN 500 MG tablet Commonly known as: GLUCOPHAGE Take 1 tablet (500 mg total) by mouth daily with breakfast. [name] What changed: additional instructions   pravastatin 20 MG tablet Commonly known as: PRAVACHOL Take 1 tablet (20 mg total) by mouth at bedtime.        Signed: Marvis Moeller, DNP, NP-C 11/30/2019, 9:28 AM

## 2019-11-30 NOTE — Evaluation (Signed)
Occupational Therapy Evaluation Patient Details Name: Paige Phillips MRN: [id number] DOB: 06-28-1950 Today's Date: 11/30/2019    History of Present Illness 69 yo female s/p L4-5 L5-s1 PLIF  PMH HNP stenosis DDD radiculopathy   Clinical Impression   Patient evaluated by Occupational Therapy with no further acute OT needs identified. All education has been completed and the patient has no further questions. See below for any follow-up Occupational Therapy or equipment needs. OT to sign off. Thank you for referral.      Follow Up Recommendations  No OT follow up    Equipment Recommendations  3 in 1 bedside commode;Other (comment) (RW)    Recommendations for Other Services       Precautions / Restrictions Precautions Precautions: Fall;Back Precaution Booklet Issued: Yes (comment) Precaution Comments: educated on back precautions for adls with handout present Required Braces or Orthoses: Spinal Brace Spinal Brace: Lumbar corset;Applied in sitting position Restrictions Weight Bearing Restrictions: No      Mobility Bed Mobility Overal bed mobility: Needs Assistance Bed Mobility: Rolling;Sidelying to Sit;Sit to Supine Rolling: Min assist Sidelying to sit: Mod assist   Sit to supine: Mod assist   General bed mobility comments: requires increased time, bed flattened to simulate home. pt and daughter educated on sequence  Transfers Overall transfer level: Needs assistance Equipment used: Rolling walker (2 wheeled) Transfers: Sit to/from Stand Sit to Stand: Min guard         General transfer comment: Cues for hand placement    Balance Overall balance assessment: Needs assistance Sitting-balance support: Feet supported Sitting balance-Leahy Scale: Good     Standing balance support: Bilateral upper extremity supported Standing balance-Leahy Scale: Poor Standing balance comment: reliant on external support                           ADL either  performed or assessed with clinical judgement   ADL Overall ADL's : Needs assistance/impaired       Grooming Details (indicate cue type and reason): educated on use of cups for oral care Upper Body Bathing: Supervision/ safety     Lower Body Bathing Details (indicate cue type and reason): educated on supervision with AE   Upper Body Dressing Details (indicate cue type and reason): educated on back brace min (A)  Lower Body Dressing: Supervision/safety;Adhering to back precautions;With adaptive equipment Lower Body Dressing Details (indicate cue type and reason): able to dress for home Toilet Transfer: Supervision/safety             General ADL Comments: in room 3n1 and RW. educated on setup of 3n1 at home and how to position for shower toilet and bedside. Daughter present for all education  Back handout provided and reviewed adls in detail. Pt educated on: clothing between brace, never sleep in brace, set an alarm at night for medication, avoid sitting for long periods of time, correct bed positioning for sleeping, correct sequence for bed mobility, avoiding lifting more than 5 pounds and never wash directly over incision. All education is complete and patient indicates understanding.     Vision Baseline Vision/History: No visual deficits       Perception     Praxis      Pertinent Vitals/Pain Pain Assessment: Faces Faces Pain Scale: Hurts little more Pain Location: surgical site Pain Descriptors / Indicators: Sore;Operative site guarding Pain Intervention(s): Monitored during session;Premedicated before session;Repositioned     Hand Dominance Right   Extremity/Trunk Assessment Upper Extremity Assessment Upper  Extremity Assessment: Overall WFL for tasks assessed   Lower Extremity Assessment Lower Extremity Assessment: Defer to PT evaluation RLE Deficits / Details: Hip flexion 2/5, knee extension 5/5, ankle dorsiflexion 5/5 LLE Deficits / Details: Hip flexion 2/5,  knee extension 5/5, ankle dorsiflexion 5/5   Cervical / Trunk Assessment Cervical / Trunk Assessment: Other exceptions Cervical / Trunk Exceptions: s/p surg   Communication Communication Communication: No difficulties   Cognition Arousal/Alertness: Awake/alert Behavior During Therapy: WFL for tasks assessed/performed Overall Cognitive Status: Within Functional Limits for tasks assessed                                     General Comments       Exercises     Shoulder Instructions      Home Living Family/patient expects to be discharged to:: Private residence Living Arrangements: Children (son, daughter in Sports coach) Available Help at Discharge: Family Type of Home: House Home Access: Stairs to enter Technical brewer of Steps: 5 Entrance Stairs-Rails: Left Home Layout: Able to live on main level with bedroom/bathroom     Bathroom Shower/Tub: Teacher, early years/pre: Standard     Home Equipment: None          Prior Functioning/Environment Level of Independence: Independent                 OT Problem List: Impaired balance (sitting and/or standing);Decreased knowledge of use of DME or AE      OT Treatment/Interventions:      OT Goals(Current goals can be found in the care plan section) Acute Rehab OT Goals Patient Stated Goal: to return home OT Goal Formulation: With patient/family Potential to Achieve Goals: Good  OT Frequency:     Barriers to D/C:            Co-evaluation              AM-PAC OT "6 Clicks" Daily Activity     Outcome Measure Help from another person eating meals?: None Help from another person taking care of personal grooming?: None Help from another person toileting, which includes using toliet, bedpan, or urinal?: None Help from another person bathing (including washing, rinsing, drying)?: None Help from another person to put on and taking off regular upper body clothing?: None Help from another  person to put on and taking off regular lower body clothing?: A Little 6 Click Score: 23   End of Session Equipment Utilized During Treatment: Rolling walker;Back brace Nurse Communication: Mobility status;Precautions  Activity Tolerance: Patient tolerated treatment well Patient left: in bed;with call bell/phone within reach;with family/visitor present  OT Visit Diagnosis: Unsteadiness on feet (R26.81)                Time: 1030-1100 OT Time Calculation (min): 30 min Charges:  OT General Charges $OT Visit: 1 Visit OT Evaluation $OT Eval Moderate Complexity: 1 Mod OT Treatments $Self Care/Home Management : 8-22 mins   Brynn, OTR/L  Acute Rehabilitation Services Pager: [phone] Office: [phone] .   Jeri Modena 11/30/2019, 12:21 PM

## 2019-11-30 NOTE — Discharge Instructions (Signed)
Wound Care °Remove dressing in 2-3 days °Leave incision open to air. °You may shower. °Do not scrub directly on incision.  °Do not put any creams, lotions, or ointments on incision. °Activity °Walk each and every day, increasing distance each day. °No lifting greater than 5 lbs.  Avoid bending, arching, and twisting. °No driving for 2 weeks; may ride as a passenger locally. °If provided with back brace, wear when out of bed.  It is not necessary to wear in bed. °Diet °Resume your normal diet.  °Return to Work °Will be discussed at you follow up appointment. °Call Your Doctor If Any of These Occur °Redness, drainage, or swelling at the wound.  °Temperature greater than 101 degrees. °Severe pain not relieved by pain medication. °Incision starts to come apart. °Follow Up Appt °Call today for appointment in 2-3 weeks ([phone]) or for problems.  If you have any hardware placed in your spine, you will need an x-ray before your appointment.  °

## 2019-11-30 NOTE — Progress Notes (Signed)
Subjective: The patient reports that she is doing well.  She reports mild incisional pain and a resolution of her lower extremity pain that was present prior to her surgery.  She does continue to have complaints of bilateral foot numbness that is most likely due to her prior diagnosis of peripheral neuropathy.  She reports ambulating multiple well times down the hall with assistance.  No acute events were reported overnight.  She feels that she is ready to be discharged to home today.  Objective: Vital signs in last 24 hours: Temp:  [97 F (36.1 C)-98.7 F (37.1 C)] 98.1 F (36.7 C) (09/22 0738) Pulse Rate:  [78-99] 89 (09/22 0738) Resp:  [14-20] 20 (09/22 0738) BP: (114-158)/(39-74) 114/46 (09/22 0738) SpO2:  [90 %-100 %] 90 % (09/22 0738)  Intake/Output from previous day: 09/21 0701 - 09/22 0700 In: 2300 [I.V.:1700; IV Piggyback:600] Out: 570 [Urine:270; Blood:300] Intake/Output this shift: No intake/output data recorded.  Physical Exam:  Awake, A/O X 4, and conversant. Patient is doing well overall and is in good spirits. MAEW with good strength that is symmetric bilaterally.  Dressing is clean dry intact.  Incision is well approximated with no drainage, erythema, or edema.  Lab Results: Recent Labs    11/29/19 0926  HGB 10.5*  HCT 31.0*   BMET Recent Labs    11/29/19 0926  NA 138  K 4.9  CL 106  GLUCOSE 178*  BUN 39*  CREATININE 0.90    Studies/Results: DG Lumbar Spine 2-3 Views  Result Date: 11/29/2019 CLINICAL DATA:  L4-S1 fusion. EXAM: LUMBAR SPINE - 2-3 VIEW; DG C-ARM 1-60 MIN COMPARISON:  09/26/2019 FINDINGS: Interval placement posterior fusion hardware with pedicle screws from L4-S1 intact and appropriately located. Interbody fusion at the L4-5 and L5-S1 levels. Exam is otherwise unchanged. IMPRESSION: Interval posterior fusion L4-S1. Electronically Signed   By: Marin Olp M.D.   On: 11/29/2019 11:23   DG C-Arm 1-60 Min  Result Date: 11/29/2019 CLINICAL  DATA:  L4-S1 fusion. EXAM: LUMBAR SPINE - 2-3 VIEW; DG C-ARM 1-60 MIN COMPARISON:  09/26/2019 FINDINGS: Interval placement posterior fusion hardware with pedicle screws from L4-S1 intact and appropriately located. Interbody fusion at the L4-5 and L5-S1 levels. Exam is otherwise unchanged. IMPRESSION: Interval posterior fusion L4-S1. Electronically Signed   By: Marin Olp M.D.   On: 11/29/2019 11:23    Assessment/Plan: Patient is postop day 1 S/P L4-5 and L5-S1 PLIF.  She appears to be recovering well from her surgery.  She has mild incisional pain that is to be expected. Continue LSO brace when OOB.  Continue working on pain management and encourage mobility and ambulation.  Continue working with therapies. Plan for discharge today.   LOS: 1 day    Marvis Moeller, DNP, NP-C 11/30/2019, 9:21 AM

## 2019-11-30 NOTE — Evaluation (Signed)
Physical Therapy Evaluation Patient Details Name: Paige Phillips MRN: [id number] DOB: Jun 09, 1950 Today's Date: 11/30/2019   History of Present Illness  69 yo female s/p L4-5 L5-s1 PLIF  PMH HNP stenosis DDD radiculopathy  Clinical Impression  Patient is s/p above surgery resulting in the deficits listed below (see PT Problem List). Prior to admission, pt lives with her son and daughter in law and is independent. Pt denies radicular symptoms and endorses surgical site soreness. Ambulating 150 feet with a walker at a min guard assist level. Negotiated 5 steps with left railing to simulate home set up. Pt presents with proximal BLE weakness and balance deficits. Positive nausea/vomiting in hallway during session. Patient will benefit from skilled PT to increase their independence and safety with mobility (while adhering to their precautions) to allow discharge to the venue listed below.     Follow Up Recommendations Supervision for mobility/OOB;Outpatient PT    Equipment Recommendations  Rolling walker with 5" wheels;3in1 (PT)    Recommendations for Other Services       Precautions / Restrictions Precautions Precautions: Fall;Back Precaution Booklet Issued: Yes (comment) Precaution Comments: Pt recalling 3/3 precautions; provided written handout Required Braces or Orthoses: Spinal Brace Spinal Brace: Lumbar corset;Applied in sitting position Restrictions Weight Bearing Restrictions: No      Mobility  Bed Mobility Overal bed mobility: Needs Assistance Bed Mobility: Rolling;Sidelying to Sit Rolling: Modified independent (Device/Increase time) Sidelying to sit: Supervision       General bed mobility comments: Increased time/effort to achieve upright  Transfers Overall transfer level: Needs assistance Equipment used: Rolling walker (2 wheeled) Transfers: Sit to/from Stand Sit to Stand: Min guard         General transfer comment: Cues for hand  placement  Ambulation/Gait Ambulation/Gait assistance: Min guard Gait Distance (Feet): 150 Feet Assistive device: Rolling walker (2 wheeled) Gait Pattern/deviations: Step-through pattern;Decreased stride length Gait velocity: decreased Gait velocity interpretation: <1.8 ft/sec, indicate of risk for recurrent falls General Gait Details: Cues for upright posture, walker proximity. Slow pace  Stairs Stairs: Yes Stairs assistance: Min guard Stair Management: One rail Left Number of Stairs: 5 General stair comments: Cues for step by step pattern, use of L rail and R HHA  Wheelchair Mobility    Modified Rankin (Stroke Patients Only)       Balance Overall balance assessment: Needs assistance Sitting-balance support: Feet supported Sitting balance-Leahy Scale: Good     Standing balance support: Bilateral upper extremity supported Standing balance-Leahy Scale: Poor Standing balance comment: reliant on external support                             Pertinent Vitals/Pain Pain Assessment: Faces Faces Pain Scale: Hurts little more Pain Location: surgical site Pain Descriptors / Indicators: Sore;Operative site guarding Pain Intervention(s): Monitored during session;Limited activity within patient's tolerance    Home Living Family/patient expects to be discharged to:: Private residence Living Arrangements: Children (son, daughter in Sports coach) Available Help at Discharge: Family Type of Home: House Home Access: Stairs to enter Entrance Stairs-Rails: Left Entrance Stairs-Number of Steps: 5 Home Layout: Able to live on main level with bedroom/bathroom Home Equipment: None      Prior Function Level of Independence: Independent               Hand Dominance        Extremity/Trunk Assessment   Upper Extremity Assessment Upper Extremity Assessment: Defer to OT evaluation    Lower Extremity  Assessment Lower Extremity Assessment: RLE deficits/detail;LLE  deficits/detail RLE Deficits / Details: Hip flexion 2/5, knee extension 5/5, ankle dorsiflexion 5/5 LLE Deficits / Details: Hip flexion 2/5, knee extension 5/5, ankle dorsiflexion 5/5    Cervical / Trunk Assessment Cervical / Trunk Assessment: Other exceptions Cervical / Trunk Exceptions: s/p PLIF  Communication   Communication: No difficulties  Cognition Arousal/Alertness: Awake/alert Behavior During Therapy: WFL for tasks assessed/performed Overall Cognitive Status: Within Functional Limits for tasks assessed                                        General Comments      Exercises     Assessment/Plan    PT Assessment Patient needs continued PT services  PT Problem List Decreased strength;Decreased activity tolerance;Decreased balance;Decreased mobility;Pain       PT Treatment Interventions Gait training;DME instruction;Stair training;Functional mobility training;Therapeutic activities;Therapeutic exercise;Balance training;Patient/family education    PT Goals (Current goals can be found in the Care Plan section)  Acute Rehab PT Goals Patient Stated Goal: did not state; agreeable to therapy PT Goal Formulation: With patient Time For Goal Achievement: 12/14/19 Potential to Achieve Goals: Good    Frequency Min 5X/week   Barriers to discharge        Co-evaluation               AM-PAC PT "6 Clicks" Mobility  Outcome Measure Help needed turning from your back to your side while in a flat bed without using bedrails?: None Help needed moving from lying on your back to sitting on the side of a flat bed without using bedrails?: None Help needed moving to and from a bed to a chair (including a wheelchair)?: A Little Help needed standing up from a chair using your arms (e.g., wheelchair or bedside chair)?: A Little Help needed to walk in hospital room?: A Little Help needed climbing 3-5 steps with a railing? : A Little 6 Click Score: 20    End of  Session Equipment Utilized During Treatment: Back brace;Gait belt Activity Tolerance: Patient tolerated treatment well Patient left: in bed;with call bell/phone within reach;with family/visitor present Nurse Communication: Mobility status PT Visit Diagnosis: Pain;Difficulty in walking, not elsewhere classified (R26.2) Pain - part of body:  (back)    Time: 8250-5397 PT Time Calculation (min) (ACUTE ONLY): 29 min   Charges:   PT Evaluation $PT Eval Low Complexity: 1 Low PT Treatments $Gait Training: 8-22 mins          Wyona Almas, PT, DPT Acute Rehabilitation Services Pager [phone] Office [phone]   Deno Etienne 11/30/2019, 9:51 AM

## 2019-11-30 NOTE — Plan of Care (Signed)
Patient alert and oriented, mae's well, voiding adequate amount of urine, swallowing without difficulty, no c/o pain at time of discharge. Patient discharged home with family. Script and discharged instructions given to patient. Patient and family stated understanding of instructions given. Patient has an appointment with Dr.Stern    

## 2019-12-01 MED FILL — Heparin Sodium (Porcine) Inj 1000 Unit/ML: INTRAMUSCULAR | Qty: 30 | Status: AC

## 2019-12-01 MED FILL — Sodium Chloride IV Soln 0.9%: INTRAVENOUS | Qty: 1000 | Status: AC

## 2019-12-01 MED FILL — Thrombin For Soln 5000 Unit: CUTANEOUS | Qty: 5000 | Status: AC

## 2019-12-21 DIAGNOSIS — M5416 Radiculopathy, lumbar region: Secondary | ICD-10-CM | POA: Diagnosis not present

## 2019-12-21 DIAGNOSIS — M48061 Spinal stenosis, lumbar region without neurogenic claudication: Secondary | ICD-10-CM | POA: Diagnosis not present

## 2020-01-05 ENCOUNTER — Other Ambulatory Visit: Payer: Self-pay

## 2020-01-05 DIAGNOSIS — E119 Type 2 diabetes mellitus without complications: Secondary | ICD-10-CM

## 2020-01-05 MED ORDER — METFORMIN HCL 500 MG PO TABS: 500.0000 mg | ORAL_TABLET | Freq: Every day | ORAL | 1 refills | Status: DC

## 2020-01-30 DIAGNOSIS — M48061 Spinal stenosis, lumbar region without neurogenic claudication: Secondary | ICD-10-CM | POA: Diagnosis not present

## 2020-02-22 ENCOUNTER — Encounter: Payer: Self-pay | Admitting: Internal Medicine

## 2020-02-22 ENCOUNTER — Ambulatory Visit (INDEPENDENT_AMBULATORY_CARE_PROVIDER_SITE_OTHER): Payer: Medicare Other | Admitting: Internal Medicine

## 2020-02-22 ENCOUNTER — Other Ambulatory Visit: Payer: Self-pay

## 2020-02-22 VITALS — BP 150/70 | HR 92 | Temp 98.3°F | Ht 64.0 in | Wt 233.0 lb

## 2020-02-22 DIAGNOSIS — E538 Deficiency of other specified B group vitamins: Secondary | ICD-10-CM | POA: Insufficient documentation

## 2020-02-22 DIAGNOSIS — E1159 Type 2 diabetes mellitus with other circulatory complications: Secondary | ICD-10-CM | POA: Diagnosis not present

## 2020-02-22 DIAGNOSIS — E875 Hyperkalemia: Secondary | ICD-10-CM | POA: Insufficient documentation

## 2020-02-22 DIAGNOSIS — Z0000 Encounter for general adult medical examination without abnormal findings: Secondary | ICD-10-CM | POA: Diagnosis not present

## 2020-02-22 DIAGNOSIS — Z1329 Encounter for screening for other suspected endocrine disorder: Secondary | ICD-10-CM | POA: Diagnosis not present

## 2020-02-22 DIAGNOSIS — Z23 Encounter for immunization: Secondary | ICD-10-CM | POA: Diagnosis not present

## 2020-02-22 DIAGNOSIS — I152 Hypertension secondary to endocrine disorders: Secondary | ICD-10-CM

## 2020-02-22 DIAGNOSIS — E119 Type 2 diabetes mellitus without complications: Secondary | ICD-10-CM

## 2020-02-22 DIAGNOSIS — D649 Anemia, unspecified: Secondary | ICD-10-CM

## 2020-02-22 DIAGNOSIS — M549 Dorsalgia, unspecified: Secondary | ICD-10-CM

## 2020-02-22 DIAGNOSIS — Z9889 Other specified postprocedural states: Secondary | ICD-10-CM | POA: Insufficient documentation

## 2020-02-22 DIAGNOSIS — Z1231 Encounter for screening mammogram for malignant neoplasm of breast: Secondary | ICD-10-CM

## 2020-02-22 LAB — CBC WITH DIFFERENTIAL/PLATELET
Basophils Absolute: 0 10*3/uL (ref 0.0–0.1)
Basophils Relative: 0.3 % (ref 0.0–3.0)
Eosinophils Absolute: 0.1 10*3/uL (ref 0.0–0.7)
Eosinophils Relative: 0.8 % (ref 0.0–5.0)
HCT: 34.3 % — ABNORMAL LOW (ref 36.0–46.0)
Hemoglobin: 11 g/dL — ABNORMAL LOW (ref 12.0–15.0)
Lymphocytes Relative: 18.8 % (ref 12.0–46.0)
Lymphs Abs: 1.3 10*3/uL (ref 0.7–4.0)
MCHC: 32.1 g/dL (ref 30.0–36.0)
MCV: 88.9 fl (ref 78.0–100.0)
Monocytes Absolute: 0.5 10*3/uL (ref 0.1–1.0)
Monocytes Relative: 6.8 % (ref 3.0–12.0)
Neutro Abs: 5.1 10*3/uL (ref 1.4–7.7)
Neutrophils Relative %: 73.3 % (ref 43.0–77.0)
Platelets: 282 10*3/uL (ref 150.0–400.0)
RBC: 3.86 Mil/uL — ABNORMAL LOW (ref 3.87–5.11)
RDW: 15.2 % (ref 11.5–15.5)
WBC: 6.9 10*3/uL (ref 4.0–10.5)

## 2020-02-22 LAB — HEMOGLOBIN A1C: Hgb A1c MFr Bld: 6.1 % (ref 4.6–6.5)

## 2020-02-22 LAB — COMPREHENSIVE METABOLIC PANEL
ALT: 7 U/L (ref 0–35)
AST: 9 U/L (ref 0–37)
Albumin: 4.2 g/dL (ref 3.5–5.2)
Alkaline Phosphatase: 79 U/L (ref 39–117)
BUN: 14 mg/dL (ref 6–23)
CO2: 25 mEq/L (ref 19–32)
Calcium: 9.1 mg/dL (ref 8.4–10.5)
Chloride: 102 mEq/L (ref 96–112)
Creatinine, Ser: 0.88 mg/dL (ref 0.40–1.20)
GFR: 67.33 mL/min (ref >60.00)
Glucose, Bld: 120 mg/dL — ABNORMAL HIGH (ref 70–99)
Potassium: 4 mEq/L (ref 3.5–5.1)
Sodium: 138 mEq/L (ref 135–145)
Total Bilirubin: 0.6 mg/dL (ref 0.2–1.2)
Total Protein: 7.3 g/dL (ref 6.0–8.3)

## 2020-02-22 LAB — LIPID PANEL
Cholesterol: 147 mg/dL (ref 0–200)
HDL: 50.7 mg/dL (ref >39.00)
LDL Cholesterol: 81 mg/dL (ref 0–99)
NonHDL: 96.74
Total CHOL/HDL Ratio: 3
Triglycerides: 79 mg/dL (ref 0.0–149.0)
VLDL: 15.8 mg/dL (ref 0.0–40.0)

## 2020-02-22 LAB — TSH: TSH: 2.52 u[IU]/mL (ref 0.35–4.50)

## 2020-02-22 LAB — VITAMIN B12: Vitamin B-12: 153 pg/mL — ABNORMAL LOW (ref 211–911)

## 2020-02-22 MED ORDER — PNEUMOCOCCAL 13-VAL CONJ VACC IM SUSP: 0.5000 mL | Freq: Once | INTRAMUSCULAR | 0 refills | Status: AC

## 2020-02-22 MED ORDER — METFORMIN HCL 500 MG PO TABS: 500.0000 mg | ORAL_TABLET | Freq: Every day | ORAL | 3 refills | Status: DC

## 2020-02-22 MED ORDER — AMLODIPINE BESYLATE 5 MG PO TABS
5.0000 mg | ORAL_TABLET | Freq: Every day | ORAL | 3 refills | Status: DC
Start: 2020-02-22 — End: 2020-08-22

## 2020-02-22 MED ORDER — PRAVASTATIN SODIUM 20 MG PO TABS: 20.0000 mg | ORAL_TABLET | Freq: Every day | ORAL | 3 refills | Status: DC

## 2020-02-22 NOTE — Progress Notes (Signed)
Chief Complaint  Patient presents with  . Follow-up  . Immunizations   annual 1. 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 [facility] for diabetes  BP elevated today   2. S/p low back surgery and numbness/tingling resolved as of 11/29/19 Dr. Vertell Limber had surgery she did not have PT will ask Dr. Vertell Limber  Back pain 4/10    Review of Systems  Constitutional: Negative for weight loss.  HENT: Negative for hearing loss.   Eyes: Negative for blurred vision.  Respiratory: Negative for shortness of breath.   Cardiovascular: Negative for chest pain.  Gastrointestinal: Negative for abdominal pain.  Musculoskeletal: Positive for back pain. Negative for falls.  Skin: Negative for rash.  Neurological: Negative for headaches.  Psychiatric/Behavioral: Negative for depression.   Past Medical History:  Diagnosis Date  . Chickenpox   . DDD (degenerative disc disease), lumbar   . Diabetes mellitus without complication ([place])   . GERD (gastroesophageal reflux disease)   . Hypertension   . Lumbar herniated disc   . Spinal stenosis    Past Surgical History:  Procedure Laterality Date  . ABDOMINAL HYSTERECTOMY  1983   fibroids   . CARPAL TUNNEL RELEASE     rt   Family History  Problem Relation Age of Onset  . Cancer Father        pancreatic cancer   . Cancer Sister        breast cancer age 25s  . Diabetes Sister   . Cancer Brother        pancreatic cancer  . Stroke Brother        died stroke  . ALS Mother        53s   Social History   Socioeconomic History  . Marital status: Married    Spouse name: Not on file  . Number of children: Not on file  . Years of education: Not on file  . Highest education level: Not on file  Occupational History  . Not on file  Tobacco Use  . Smoking status: Never Smoker  .  Smokeless tobacco: Never Used  Vaping Use  . Vaping Use: Never used  Substance and Sexual Activity  . Alcohol use: Yes    Comment: social  . Drug use: Never  . Sexual activity: Not Currently  Other Topics Concern  . 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 acetaminophen (TYLENOL) 650 MG CR tablet Take 1,300 mg by mouth every 8 (eight) hours as needed for pain.  . Alpha-Lipoic Acid 600 MG CAPS Take 600 mg by mouth 2 (two) times daily.  . Cholecalciferol (HM VITAMIN D3) 100 MCG (  4000 UT) CAPS Take 4,000 Units by mouth daily.  . methocarbamol (ROBAXIN) 500 MG tablet Take 500 mg by mouth 4 (four) times daily as needed.  . [DISCONTINUED] losartan (COZAAR) 25 MG tablet Take 1 tablet (25 mg total) by mouth daily.  . [DISCONTINUED] metFORMIN (GLUCOPHAGE) 500 MG tablet Take 1 tablet (500 mg total) by mouth daily with breakfast. [name]  . [DISCONTINUED] pravastatin (PRAVACHOL) 20 MG tablet Take 1 tablet (20 mg total) by mouth at bedtime.   Allergies  Allergen Reactions  . Losartan     High potassium     Recent Results (from the past 2160 hour(s))  SARS CORONAVIRUS 2 (TAT 6-24 HRS) Nasopharyngeal Nasopharyngeal Swab     Status: None   Collection Time: 11/25/19  8:43 AM   Specimen: Nasopharyngeal Swab  Result Value Ref Range   SARS Coronavirus 2 NEGATIVE NEGATIVE    Comment: (NOTE) SARS-CoV-2 target nucleic acids are NOT DETECTED.  The SARS-CoV-2 RNA is generally detectable in upper and lower respiratory specimens during  the acute phase of infection. Negative results do not preclude SARS-CoV-2 infection, do not rule out co-infections with other pathogens, and should not be used as the sole basis for treatment or other patient management decisions. Negative results must be combined with clinical observations, patient history, and epidemiological information. The expected result is Negative.  Fact Sheet for Patients: SugarRoll.be  Fact Sheet for Healthcare Providers: https://www.woods-mathews.com/  This test is not yet approved or cleared by the [place] FDA and  has been authorized for detection and/or diagnosis of SARS-CoV-2 by FDA under an Emergency Use Authorization (EUA). This EUA will remain  in effect (meaning this test can be used) for the duration of the COVID-19 declaration under Se ction 564(b)(1) of the Act, 21 U.S.C. section 360bbb-3(b)(1), unless the authorization is terminated or revoked sooner.  Performed at [facility], [place] [address] [place], [place] [address]   Glucose, capillary     Status: Abnormal   Collection Time: 11/29/19  5:57 AM  Result Value Ref Range   Glucose-Capillary 128 (H) 70 - 99 mg/dL    Comment: Glucose reference range applies only to samples taken after fasting for at least 8 hours.   Comment 1 Notify RN   ABO/Rh     Status: None   Collection Time: 11/29/19  6:36 AM  Result Value Ref Range   ABO/RH(D)      O POS Performed at [facility] [address] [place],  [address]   I-STAT, [place] Cl[address] 8     Status: Abnormal   Collection Time: 11/29/19  9:26 AM  Result Value Ref Range   Sodium 138 135 - 145 mmol/L   Potassium 4.9 3.5 - 5.1 mmol/L   Chloride 106 98 - 111 mmol/L   BUN 39 (H) 8 - 23 mg/dL   Creatinine, Ser 0.90 0.44 - 1.00 mg/dL   Glucose, Bld 178 (H) 70 - 99 mg/dL    Comment: Glucose reference range applies only to samples taken after fasting for at least 8 hours.   Calcium, Ion 1.22  1.15 - 1.40 mmol/L   TCO2 24 22 - 32 mmol/L   Hemoglobin 10.5 (L) 12.0 - 15.0 g/dL   HCT 31.0 (L) 36.0 - 46.0 %  Glucose, capillary     Status: Abnormal   Collection Time: 11/29/19 11:32 AM  Result Value Ref Range   Glucose-Capillary 173 (H) 70 - 99 mg/dL    Comment: Glucose reference range applies  only to samples taken after fasting for at least 8 hours.  Glucose, capillary     Status: Abnormal   Collection Time: 11/29/19  4:35 PM  Result Value Ref Range   Glucose-Capillary 214 (H) 70 - 99 mg/dL    Comment: Glucose reference range applies only to samples taken after fasting for at least 8 hours.   Comment 1 Notify RN    Comment 2 Document in Chart   Glucose, capillary     Status: Abnormal   Collection Time: 11/29/19  9:06 PM  Result Value Ref Range   Glucose-Capillary 146 (H) 70 - 99 mg/dL    Comment: Glucose reference range applies only to samples taken after fasting for at least 8 hours.   Comment 1 Notify RN    Comment 2 Document in Chart   Glucose, capillary     Status: Abnormal   Collection Time: 11/30/19  6:08 AM  Result Value Ref Range   Glucose-Capillary 153 (H) 70 - 99 mg/dL    Comment: Glucose reference range applies only to samples taken after fasting for at least 8 hours.   Comment 1 Notify RN    Comment 2 Document in Chart    Objective  Body mass index is 39.99 kg/m. Wt Readings from Last 3 Encounters:  02/22/20 233 lb (105.7 kg)  11/29/19 239 lb 3.2 oz (108.5 kg)  11/24/19 239 lb 3.2 oz (108.5 kg)   Temp Readings from Last 3 Encounters:  02/22/20 98.3 F (36.8 C) (Oral)  11/30/19 98.1 F (36.7 C) (Oral)  11/24/19 98.4 F (36.9 C) (Oral)   BP Readings from Last 3 Encounters:  02/22/20 (!) 150/70  11/30/19 (!) 114/46  11/24/19 (!) 145/72   Pulse Readings from Last 3 Encounters:  02/22/20 92  11/30/19 89  11/24/19 78    Physical Exam Vitals reviewed.  Constitutional:      Appearance: Normal appearance. She is obese.  HENT:     Head:  Normocephalic and atraumatic.  Eyes:     Conjunctiva/sclera: Conjunctivae normal.     Pupils: Pupils are equal, round, and reactive to light.  Cardiovascular:     Rate and Rhythm: Normal rate and regular rhythm.     Heart sounds: No murmur heard.   Pulmonary:     Effort: Pulmonary effort is normal.     Breath sounds: Normal breath sounds.  Skin:    General: Skin is warm and dry.  Neurological:     General: No focal deficit present.     Mental Status: She is alert and oriented to person, place, and time. Mental status is at baseline.  Psychiatric:        Mood and Affect: Mood normal.        Behavior: Behavior normal.        Thought Content: Thought content normal.        Judgment: Judgment normal.     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 Michail Sermon -call to see when due  Likely in 2022 max 1 year   Pap s/p hysterectomy no h/o abnormal pap last 6-7 years ago ovaries intact  Never smoker  rec healthy diet and exercise   Hypertension associated with diabetes ([place]) - Plan: amLODipine (NORVASC) 5 MG tablet, Comprehensive metabolic panel, Lipid panel, CBC w/Diff, Hemoglobin A1c, Urinalysis, Routine w reflex microscopic, Microalbumin / creatinine urine ratio D/c losartan due to worsening renal function and high K  Metformin 500 mg qd  rec [facility]/u [facility] Cont pravastatin 20 mg qhs  Monitor BP goal <130/<80 no meds today due she was fasting   Anemia, likely due to blood loss - Plan: CBC w/Diff, Iron, TIBC and Ferritin Panel  Hyperkalemia  rec check   Low back pain s/p surgery 11/29/19 Dr.  Vertell Limber  F/u sch 03/07/20  Will ask if needs PT      Provider: Dr. [name] [facility]

## 2020-02-22 NOTE — Patient Instructions (Addendum)
Goal blood pressure <130/<80 call back if not 2 hours after medications   Prevnar vaccine (pneumonia shots) consider after moderna booster at the pharmacy  Pneumonia 23 vaccine in 1 year   Call [facility] and see about f/u for colonoscopy please ?  Dr. Michail Sermon

## 2020-02-23 ENCOUNTER — Telehealth: Payer: Self-pay

## 2020-02-23 LAB — URINALYSIS, ROUTINE W REFLEX MICROSCOPIC
Bacteria, UA: NONE SEEN /HPF
Bilirubin Urine: NEGATIVE
Glucose, UA: NEGATIVE
Hgb urine dipstick: NEGATIVE
Ketones, ur: NEGATIVE
Leukocytes,Ua: NEGATIVE
Nitrite: NEGATIVE
RBC / HPF: NONE SEEN /HPF (ref 0–2)
Specific Gravity, Urine: 1.03 (ref 1.001–1.03)
pH: <=5 (ref 5.0–8.0)

## 2020-02-23 LAB — IRON,TIBC AND FERRITIN PANEL
%SAT: 13 % (calc) — ABNORMAL LOW (ref 16–45)
Ferritin: 27 ng/mL (ref 16–288)
Iron: 44 ug/dL — ABNORMAL LOW (ref 45–160)
TIBC: 328 mcg/dL (calc) (ref 250–450)

## 2020-02-23 LAB — MICROALBUMIN / CREATININE URINE RATIO
Creatinine, Urine: 282 mg/dL — ABNORMAL HIGH (ref 20–275)
Microalb Creat Ratio: 8 mcg/mg creat (ref <30)
Microalb, Ur: 2.2 mg/dL

## 2020-02-23 NOTE — Telephone Encounter (Signed)
-----   Message from Delorise Jackson, MD sent at 02/22/2020  6:28 PM EST ----- Trace protein in urine need to control BP goal <130/<80   Blood cts sl improved after surgery consider womens mvt with iron daily I.e centrum or nature made   Liver kidneys ok   Cholesterol improved   B12 low  -rec B12 injections Q30 days she can do a home but 1st come here and see how to do them please schedule   Thyroid labs normal   A1C stable

## 2020-03-08 DIAGNOSIS — M5416 Radiculopathy, lumbar region: Secondary | ICD-10-CM | POA: Diagnosis not present

## 2020-03-08 DIAGNOSIS — M4316 Spondylolisthesis, lumbar region: Secondary | ICD-10-CM | POA: Diagnosis not present

## 2020-03-08 DIAGNOSIS — M5441 Lumbago with sciatica, right side: Secondary | ICD-10-CM | POA: Diagnosis not present

## 2020-03-08 DIAGNOSIS — M48061 Spinal stenosis, lumbar region without neurogenic claudication: Secondary | ICD-10-CM | POA: Diagnosis not present

## 2020-03-14 ENCOUNTER — Other Ambulatory Visit: Payer: Self-pay

## 2020-03-14 ENCOUNTER — Ambulatory Visit (INDEPENDENT_AMBULATORY_CARE_PROVIDER_SITE_OTHER): Payer: Medicare Other

## 2020-03-14 DIAGNOSIS — E538 Deficiency of other specified B group vitamins: Secondary | ICD-10-CM | POA: Diagnosis not present

## 2020-03-14 MED ORDER — CYANOCOBALAMIN 1000 MCG/ML IJ SOLN
1000.0000 ug | Freq: Once | INTRAMUSCULAR | Status: AC
  Administered 2020-03-14: 1000 ug via INTRAMUSCULAR

## 2020-03-14 NOTE — Progress Notes (Signed)
Pt came in to day for B-12 injection in left deltoid IM. Patient tolerated well with no signs of distress. I did show patient how to give to herself & explained process of drawing medication when sent to pharmacy. I also explained that mist patient would give in leg in vastus lateralis muscle. After explaining to patient she felt that she would be more comfortable having done in our office. She schedule to have done for first month once weekly then once monthly thereafter.

## 2020-03-21 ENCOUNTER — Ambulatory Visit (INDEPENDENT_AMBULATORY_CARE_PROVIDER_SITE_OTHER): Payer: Medicare Other

## 2020-03-21 ENCOUNTER — Other Ambulatory Visit: Payer: Self-pay

## 2020-03-21 DIAGNOSIS — E538 Deficiency of other specified B group vitamins: Secondary | ICD-10-CM | POA: Diagnosis not present

## 2020-03-21 MED ORDER — CYANOCOBALAMIN 1000 MCG/ML IJ SOLN
1000.0000 ug | Freq: Once | INTRAMUSCULAR | Status: AC
  Administered 2020-03-21: 1000 ug via INTRAMUSCULAR

## 2020-03-21 NOTE — Progress Notes (Signed)
Patient presented for B 12 injection to left deltoid, patient voiced no concerns nor showed any signs of distress during injection. 

## 2020-03-28 ENCOUNTER — Other Ambulatory Visit: Payer: Self-pay

## 2020-03-28 ENCOUNTER — Ambulatory Visit (INDEPENDENT_AMBULATORY_CARE_PROVIDER_SITE_OTHER): Payer: Medicare Other | Admitting: *Deleted

## 2020-03-28 DIAGNOSIS — E538 Deficiency of other specified B group vitamins: Secondary | ICD-10-CM

## 2020-03-28 MED ORDER — CYANOCOBALAMIN 1000 MCG/ML IJ SOLN
1000.0000 ug | Freq: Once | INTRAMUSCULAR | Status: AC
  Administered 2020-03-28: 1000 ug via INTRAMUSCULAR

## 2020-03-28 NOTE — Progress Notes (Signed)
Patient presented for B 12 injection to left deltoid, patient voiced no concerns nor showed any signs of distress during injection. 

## 2020-04-04 ENCOUNTER — Ambulatory Visit (INDEPENDENT_AMBULATORY_CARE_PROVIDER_SITE_OTHER): Payer: Medicare Other

## 2020-04-04 ENCOUNTER — Other Ambulatory Visit: Payer: Self-pay

## 2020-04-04 DIAGNOSIS — E538 Deficiency of other specified B group vitamins: Secondary | ICD-10-CM | POA: Diagnosis not present

## 2020-04-04 MED ORDER — CYANOCOBALAMIN 1000 MCG/ML IJ SOLN
1000.0000 ug | Freq: Once | INTRAMUSCULAR | Status: AC
  Administered 2020-04-04: 1000 ug via INTRAMUSCULAR

## 2020-04-04 NOTE — Progress Notes (Signed)
Patient presented for B 12 injection to left deltoid, patient voiced no concerns nor showed any signs of distress during injection. 

## 2020-04-16 ENCOUNTER — Ambulatory Visit (INDEPENDENT_AMBULATORY_CARE_PROVIDER_SITE_OTHER): Payer: Medicare Other

## 2020-04-16 VITALS — Ht 64.0 in | Wt 233.0 lb

## 2020-04-16 DIAGNOSIS — Z0000 Encounter for general adult medical examination without abnormal findings: Secondary | ICD-10-CM

## 2020-04-16 NOTE — Patient Instructions (Addendum)
[name] , Thank you for taking time to come for your Medicare Wellness Visit. I appreciate your ongoing commitment to your health goals. Please review the following plan we discussed and let me know if I can assist you in the future.   These are the goals we discussed: Goals    . Maintain Healthy Lifestyle     Stay active Healthy diet Good water intake       This is a list of the screening recommended for you and due dates:  Health Maintenance  Topic Date Due  . Eye exam for diabetics  Never done  . Tetanus Vaccine  04/16/2021*  . Pneumonia vaccines (1 of 2 - PCV13) 04/16/2021*  . Complete foot exam   08/22/2020  . Hemoglobin A1C  08/22/2020  . Urine Protein Check  02/21/2021  . Mammogram  04/21/2021  . Colon Cancer Screening  07/17/2029  . DEXA scan (bone density measurement)  Completed  . COVID-19 Vaccine  Completed  .  Hepatitis C: One time screening is recommended by Center for Disease Control  (CDC) for  adults born from [birth year] through 1964.   Completed  . Flu Shot  Discontinued  *Topic was postponed. The date shown is not the original due date.    Immunizations Immunization History  Administered Date(s) Administered  . Fluad Quad(high Dose 65+) 02/22/2020  . Moderna Sars-Covid-2 Vaccination 04/23/2019, 05/12/2019   Keep all routine maintenance appointments.   Next scheduled nurse visit 05/08/20 @ 9:30, B12 injection  Follow up 08/22/20 @ 10:30  Advanced directives: not yet completed.  Conditions/risks identified: none new.  Follow up in one year for your annual wellness visit.   Preventive Care 69 Years and Older, Female Preventive care refers to lifestyle choices and visits with your health care provider that can promote health and wellness. What does preventive care include?  A yearly physical exam. This is also called an annual well check.  Dental exams once or twice a year.  Routine eye exams. Ask your health care provider how often you should have  your eyes checked.  Personal lifestyle choices, including:  Daily care of your teeth and gums.  Regular physical activity.  Eating a healthy diet.  Avoiding tobacco and drug use.  Limiting alcohol use.  Practicing safe sex.  Taking low-dose aspirin every day.  Taking vitamin and mineral supplements as recommended by your health care provider. What happens during an annual well check? The services and screenings done by your health care provider during your annual well check will depend on your age, overall health, lifestyle risk factors, and family history of disease. Counseling  Your health care provider may ask you questions about your:  Alcohol use.  Tobacco use.  Drug use.  Emotional well-being.  Home and relationship well-being.  Sexual activity.  Eating habits.  History of falls.  Memory and ability to understand (cognition).  Work and work Statistician.  Reproductive health. Screening  You may have the following tests or measurements:  Height, weight, and BMI.  Blood pressure.  Lipid and cholesterol levels. These may be checked every 5 years, or more frequently if you are over 69 years old.  Skin check.  Lung cancer screening. You may have this screening every year starting at age 69 if you have a 30-pack-year history of smoking and currently smoke or have quit within the past 15 years.  Fecal occult blood test (FOBT) of the stool. You may have this test every year starting at age  69.  Flexible sigmoidoscopy or colonoscopy. You may have a sigmoidoscopy every 5 years or a colonoscopy every 10 years starting at age 12.  Hepatitis C blood test.  Hepatitis B blood test.  Sexually transmitted disease (STD) testing.  Diabetes screening. This is done by checking your blood sugar (glucose) after you have not eaten for a while (fasting). You may have this done every 1-3 years.  Bone density scan. This is done to screen for osteoporosis. You may have  this done starting at age 69.  Mammogram. This may be done every 1-2 years. Talk to your health care provider about how often you should have regular mammograms. Talk with your health care provider about your test results, treatment options, and if necessary, the need for more tests. Vaccines  Your health care provider may recommend certain vaccines, such as:  Influenza vaccine. This is recommended every year.  Tetanus, diphtheria, and acellular pertussis (Tdap, Td) vaccine. You may need a Td booster every 10 years.  Zoster vaccine. You may need this after age 69.  Pneumococcal 13-valent conjugate (PCV13) vaccine. One dose is recommended after age 45.  Pneumococcal polysaccharide (PPSV23) vaccine. One dose is recommended after age 62. Talk to your health care provider about which screenings and vaccines you need and how often you need them. This information is not intended to replace advice given to you by your health care provider. Make sure you discuss any questions you have with your health care provider. Document Released: 03/23/2015 Document Revised: 11/14/2015 Document Reviewed: 12/26/2014 Elsevier Interactive Patient Education  2017 [place] Prevention in the Home Falls can cause injuries. They can happen to people of all ages. There are many things you can do to make your home safe and to help prevent falls. What can I do on the outside of my home?  Regularly fix the edges of walkways and driveways and fix any cracks.  Remove anything that might make you trip as you walk through a door, such as a raised step or threshold.  Trim any bushes or trees on the path to your home.  Use bright outdoor lighting.  Clear any walking paths of anything that might make someone trip, such as rocks or tools.  Regularly check to see if handrails are loose or broken. Make sure that both sides of any steps have handrails.  Any raised decks and porches should have guardrails on  the edges.  Have any leaves, snow, or ice cleared regularly.  Use sand or salt on walking paths during winter.  Clean up any spills in your garage right away. This includes oil or grease spills. What can I do in the bathroom?  Use night lights.  Install grab bars by the toilet and in the tub and shower. Do not use towel bars as grab bars.  Use non-skid mats or decals in the tub or shower.  If you need to sit down in the shower, use a plastic, non-slip stool.  Keep the floor dry. Clean up any water that spills on the floor as soon as it happens.  Remove soap buildup in the tub or shower regularly.  Attach bath mats securely with double-sided non-slip rug tape.  Do not have throw rugs and other things on the floor that can make you trip. What can I do in the bedroom?  Use night lights.  Make sure that you have a light by your bed that is easy to reach.  Do not use any sheets  or blankets that are too big for your bed. They should not hang down onto the floor.  Have a firm chair that has side arms. You can use this for support while you get dressed.  Do not have throw rugs and other things on the floor that can make you trip. What can I do in the kitchen?  Clean up any spills right away.  Avoid walking on wet floors.  Keep items that you use a lot in easy-to-reach places.  If you need to reach something above you, use a strong step stool that has a grab bar.  Keep electrical cords out of the way.  Do not use floor polish or wax that makes floors slippery. If you must use wax, use non-skid floor wax.  Do not have throw rugs and other things on the floor that can make you trip. What can I do with my stairs?  Do not leave any items on the stairs.  Make sure that there are handrails on both sides of the stairs and use them. Fix handrails that are broken or loose. Make sure that handrails are as long as the stairways.  Check any carpeting to make sure that it is firmly  attached to the stairs. Fix any carpet that is loose or worn.  Avoid having throw rugs at the top or bottom of the stairs. If you do have throw rugs, attach them to the floor with carpet tape.  Make sure that you have a light switch at the top of the stairs and the bottom of the stairs. If you do not have them, ask someone to add them for you. What else can I do to help prevent falls?  Wear shoes that:  Do not have high heels.  Have rubber bottoms.  Are comfortable and fit you well.  Are closed at the toe. Do not wear sandals.  If you use a stepladder:  Make sure that it is fully opened. Do not climb a closed stepladder.  Make sure that both sides of the stepladder are locked into place.  Ask someone to hold it for you, if possible.  Clearly mark and make sure that you can see:  Any grab bars or handrails.  First and last steps.  Where the edge of each step is.  Use tools that help you move around (mobility aids) if they are needed. These include:  Canes.  Walkers.  Scooters.  Crutches.  Turn on the lights when you go into a dark area. Replace any light bulbs as soon as they burn out.  Set up your furniture so you have a clear path. Avoid moving your furniture around.  If any of your floors are uneven, fix them.  If there are any pets around you, be aware of where they are.  Review your medicines with your doctor. Some medicines can make you feel dizzy. This can increase your chance of falling. Ask your doctor what other things that you can do to help prevent falls. This information is not intended to replace advice given to you by your health care provider. Make sure you discuss any questions you have with your health care provider. Document Released: 12/21/2008 Document Revised: 08/02/2015 Document Reviewed: 03/31/2014 Elsevier Interactive Patient Education  2017 [employer].

## 2020-04-16 NOTE — Progress Notes (Addendum)
Subjective:   Paige Phillips is a 70 y.o. female who presents for an Initial Medicare Annual Wellness Visit.  Review of Systems    No ROS.  Medicare Wellness Virtual Visit.    Cardiac Risk Factors include: advanced age (>90men, >64 women);hypertension;diabetes mellitus     Objective:    Today's Vitals   04/16/20 1305  Weight: 233 lb (105.7 kg)  Height: 5\' 4"  (1.626 m)   Body mass index is 39.99 kg/m.  Advanced Directives 04/16/2020 11/24/2019  Does Patient Have a Medical Advance Directive? No No  Would patient like information on creating a medical advance directive? No - Patient declined -    Current Medications (verified) Outpatient Encounter Medications as of 04/16/2020  Medication Sig  . acetaminophen (TYLENOL) 650 MG CR tablet Take 1,300 mg by mouth every 8 (eight) hours as needed for pain.  . Alpha-Lipoic Acid 600 MG CAPS Take 600 mg by mouth 2 (two) times daily.  Marland Kitchen amLODipine (NORVASC) 5 MG tablet Take 1 tablet (5 mg total) by mouth daily.  . Cholecalciferol (HM VITAMIN D3) 100 MCG (4000 UT) CAPS Take 4,000 Units by mouth daily.  . metFORMIN (GLUCOPHAGE) 500 MG tablet Take 1 tablet (500 mg total) by mouth daily with breakfast. [name]  . methocarbamol (ROBAXIN) 500 MG tablet Take 500 mg by mouth 4 (four) times daily as needed.  . pravastatin (PRAVACHOL) 20 MG tablet Take 1 tablet (20 mg total) by mouth at bedtime.   No facility-administered encounter medications on file as of 04/16/2020.    Allergies (verified) Losartan   History: Past Medical History:  Diagnosis Date  . Chickenpox   . DDD (degenerative disc disease), lumbar   . Diabetes mellitus without complication ([place])   . GERD (gastroesophageal reflux disease)   . Hypertension   . Lumbar herniated disc   . Spinal stenosis    Past Surgical History:  Procedure Laterality Date  . ABDOMINAL HYSTERECTOMY  1983   fibroids   . CARPAL TUNNEL RELEASE     rt   Family History  Problem Relation Age of  Onset  . Cancer Father        pancreatic cancer   . Cancer Sister        breast cancer age 66s  . Diabetes Sister   . Cancer Brother        pancreatic cancer  . Stroke Brother        died stroke  . ALS Mother        77s   Social History   Socioeconomic History  . Marital status: Married    Spouse name: Not on file  . Number of children: Not on file  . Years of education: Not on file  . Highest education level: Not on file  Occupational History  . Not on file  Tobacco Use  . Smoking status: Never Smoker  . Smokeless tobacco: Never Used  Vaping Use  . Vaping Use: Never used  Substance and Sexual Activity  . Alcohol use: Yes    Comment: social  . Drug use: Never  . Sexual activity: Not Currently  Other Topics Concern  . 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 Difficulty of Paying Living Expenses: Not hard at all  Food Insecurity: No Food Insecurity  . Worried About Charity fundraiser in the Last Year: Never true  . Ran Out of Food in the Last Year: Never true  Transportation Needs: No Transportation Needs  . Lack of Transportation (Medical): No  . Lack of Transportation (Non-Medical): No  Physical Activity: Not on file  Stress: No Stress Concern Present  . Feeling of Stress : Not at all  Social Connections: Unknown  . Frequency of Communication with Friends and Family: More than three times a week  . Frequency of Social Gatherings with Friends and Family: More than three times a week  . Attends Religious Services: Not on file  . Active Member of Clubs or Organizations: Not on file  . Attends Archivist Meetings: Not on file  . Marital Status: Not on file    Tobacco Counseling Counseling given: Not  Answered   Clinical Intake:  Pre-visit preparation completed: Yes        Diabetes: Yes (Followed by pcp)  How often do you need to have someone help you when you read instructions, pamphlets, or other written materials from your doctor or pharmacy?: 1 - Never  Nutrition Risk Assessment: Has the patient had any N/V/D within the last 2 weeks?  No  Does the patient have any non-healing wounds?  No  Has the patient had any unintentional weight loss or weight gain?  No   Diabetes: If diabetic, was a CBG obtained today?  No  Did the patient bring in their glucometer from home?  No  How often do you monitor your CBG's? Does not check.   Financial Strains and Diabetes Management: Are you having any financial strains with the device, your supplies or your medication? No .  Does the patient want to be seen by Chronic Care Management for management of their diabetes?  No  Would the patient like to be referred to a Nutritionist or for Diabetic Management?  No   Diabetic Exams: Diabetic Foot Exam: Completed 08/23/19  Interpreter Needed?: No      Activities of Daily Living In your present state of health, do you have any difficulty performing the following activities: 04/16/2020 11/24/2019  Hearing? N N  Vision? N N  Difficulty concentrating or making decisions? N N  Walking or climbing stairs? N N  Dressing or bathing? N N  Doing errands, shopping? N N  Preparing Food and eating ? N -  Using the Toilet? N -  In the past six months, have you accidently leaked urine? N -  Do you have problems with loss of bowel control? N -  Managing your Medications? N -  Managing your Finances? N -  Housekeeping or managing your Housekeeping? N -  Some recent data might be hidden    Patient Care Team: McLean-Scocuzza, Nino Glow, MD as PCP - General (Internal Medicine)  Indicate any recent Medical Services you may have received from other than [facility] providers in the past year (date may be  approximate).     Assessment:   This is a routine wellness examination for Paige Phillips.  I connected with Phebe today by telephone and verified that I am speaking with the correct person using two identifiers. Location patient: home Location provider: work Persons participating in the virtual visit: patient, Marine scientist.    I discussed the limitations, risks, security and  privacy concerns of performing an evaluation and management service by telephone and the availability of in person appointments. The patient expressed understanding and verbally consented to this telephonic visit.    Interactive audio and video telecommunications were attempted between this provider and patient, however failed, due to patient having technical difficulties OR patient did not have access to video capability.  We continued and completed visit with audio only.  Some vital signs may be absent or patient reported.   Hearing/Vision screen  Hearing Screening   125Hz  250Hz  500Hz  1000Hz  2000Hz  3000Hz  4000Hz  6000Hz  8000Hz   Right ear:           Left ear:           Comments: Patient is able to hear conversational tones without difficulty.  No issues reported.  Vision Screening Comments: Virtual visit  Dietary issues and exercise activities discussed: Current Exercise Habits: Home exercise routine  Goals    . Maintain Healthy Lifestyle     Stay active Healthy diet Good water intake      Depression Screen PHQ 2/9 Scores 04/16/2020 02/18/2019 12/25/2017  PHQ - 2 Score 0 0 0    Fall Risk Fall Risk  04/16/2020 02/22/2020 08/23/2019 02/18/2019 12/25/2017  Falls in the past year? 0 0 0 0 Yes  Number falls in past yr: 0 0 0 - 1  Injury with Fall? 0 0 0 - No  Follow up Falls evaluation completed Falls evaluation completed Falls evaluation completed Falls evaluation completed -    FALL RISK PREVENTION PERTAINING TO THE HOME: Handrails in use when climbing stairs?Yes Home free of loose throw rugs in walkways, pet beds,  electrical cords, etc? Yes  Adequate lighting in your home to reduce risk of falls? Yes   ASSISTIVE DEVICES UTILIZED TO PREVENT FALLS: Use of a cane, walker or w/c? No   TIMED UP AND GO: Was the test performed? No . Virtual visit.   Cognitive Function:  Patient is alert and oriented x3.  Denies difficulty focusing, memory loss. Enjoys sodoku and online games. MMSE/6CIT deferred. Normal by direct communication/observation.       Immunizations Immunization History  Administered Date(s) Administered  . Fluad Quad(high Dose 65+) 02/22/2020  . Moderna Sars-Covid-2 Vaccination 04/23/2019, 05/12/2019, 02/22/2020    TDAP status: Due, Education has been provided regarding the importance of this vaccine. Advised may receive this vaccine at local pharmacy or Health Dept. Aware to provide a copy of the vaccination record if obtained from local pharmacy or Health Dept. Verbalized acceptance and understanding. Deferred.    Health Maintenance Health Maintenance  Topic Date Due  . OPHTHALMOLOGY EXAM  Never done  . TETANUS/TDAP  04/16/2021 (Originally 04/23/1969)  . PNA vac Low Risk Adult (1 of 2 - PCV13) 04/16/2021 (Originally 04/23/2016)  . FOOT EXAM  08/22/2020  . HEMOGLOBIN A1C  08/22/2020  . URINE MICROALBUMIN  02/21/2021  . MAMMOGRAM  04/21/2021  . COLONOSCOPY (Pts 45-69yrs Insurance coverage will need to be confirmed)  07/17/2029  . DEXA SCAN  Completed  . COVID-19 Vaccine  Completed  . Hepatitis C Screening  Completed  . INFLUENZA VACCINE  Discontinued   Lung Cancer Screening: (Low Dose CT Chest recommended if Age 74-80 years, 30 pack-year currently smoking OR have quit w/in 15years.) does not qualify.   Hepatitis C Screening: Completed 12/25/17.  PNA vaccine- deferred per patient preference.   Dental Screening: Recommended annual dental exams for proper oral hygiene.  Community Resource Referral / Chronic Care Management: CRR required this  visit?  No   CCM required this  visit?  No      Plan:   Keep all routine maintenance appointments.   Next scheduled nurse visit 05/08/20 @ 9:30, B12 injection  Follow up 08/22/20 @ 10:30  I have personally reviewed and noted the following in the patient's chart:   . Medical and social history . Use of alcohol, tobacco or illicit drugs  . Current medications and supplements . Functional ability and status . Nutritional status . Physical activity . Advanced directives . List of other physicians . Hospitalizations, surgeries, and ER visits in previous 12 months . Vitals . Screenings to include cognitive, depression, and falls . Referrals and appointments  In addition, I have reviewed and discussed with patient certain preventive protocols, quality metrics, and best practice recommendations. A written personalized care plan for preventive services as well as general preventive health recommendations were provided to patient via mychart.     Varney Biles, LPN   06/09/7406     Agree with plan. Mable Paris, NP

## 2020-04-18 DIAGNOSIS — M5459 Other low back pain: Secondary | ICD-10-CM | POA: Diagnosis not present

## 2020-04-18 DIAGNOSIS — I10 Essential (primary) hypertension: Secondary | ICD-10-CM | POA: Diagnosis not present

## 2020-04-18 DIAGNOSIS — M5416 Radiculopathy, lumbar region: Secondary | ICD-10-CM | POA: Diagnosis not present

## 2020-04-25 DIAGNOSIS — M48061 Spinal stenosis, lumbar region without neurogenic claudication: Secondary | ICD-10-CM | POA: Diagnosis not present

## 2020-05-02 DIAGNOSIS — M48061 Spinal stenosis, lumbar region without neurogenic claudication: Secondary | ICD-10-CM | POA: Diagnosis not present

## 2020-05-08 ENCOUNTER — Other Ambulatory Visit: Payer: Self-pay

## 2020-05-08 ENCOUNTER — Ambulatory Visit (INDEPENDENT_AMBULATORY_CARE_PROVIDER_SITE_OTHER): Payer: Medicare Other

## 2020-05-08 DIAGNOSIS — E538 Deficiency of other specified B group vitamins: Secondary | ICD-10-CM

## 2020-05-08 MED ORDER — CYANOCOBALAMIN 1000 MCG/ML IJ SOLN
1000.0000 ug | Freq: Once | INTRAMUSCULAR | Status: AC
  Administered 2020-05-08: 1000 ug via INTRAMUSCULAR

## 2020-05-08 NOTE — Progress Notes (Signed)
Patient presented for B 12 injection to left deltoid, patient voiced no concerns nor showed any signs of distress during injection. 

## 2020-05-16 DIAGNOSIS — M48061 Spinal stenosis, lumbar region without neurogenic claudication: Secondary | ICD-10-CM | POA: Diagnosis not present

## 2020-05-25 DIAGNOSIS — M48061 Spinal stenosis, lumbar region without neurogenic claudication: Secondary | ICD-10-CM | POA: Diagnosis not present

## 2020-05-30 DIAGNOSIS — M4316 Spondylolisthesis, lumbar region: Secondary | ICD-10-CM | POA: Diagnosis not present

## 2020-05-30 DIAGNOSIS — M48061 Spinal stenosis, lumbar region without neurogenic claudication: Secondary | ICD-10-CM | POA: Diagnosis not present

## 2020-05-30 DIAGNOSIS — M5416 Radiculopathy, lumbar region: Secondary | ICD-10-CM | POA: Diagnosis not present

## 2020-05-30 DIAGNOSIS — M5441 Lumbago with sciatica, right side: Secondary | ICD-10-CM | POA: Diagnosis not present

## 2020-06-08 ENCOUNTER — Ambulatory Visit (INDEPENDENT_AMBULATORY_CARE_PROVIDER_SITE_OTHER): Payer: Medicare Other | Admitting: *Deleted

## 2020-06-08 ENCOUNTER — Other Ambulatory Visit: Payer: Self-pay

## 2020-06-08 DIAGNOSIS — E538 Deficiency of other specified B group vitamins: Secondary | ICD-10-CM

## 2020-06-08 MED ORDER — CYANOCOBALAMIN 1000 MCG/ML IJ SOLN
1000.0000 ug | Freq: Once | INTRAMUSCULAR | Status: AC
  Administered 2020-06-08: 1000 ug via INTRAMUSCULAR

## 2020-06-08 NOTE — Progress Notes (Signed)
Patient presented for B 12 injection to right deltoid, patient voiced no concerns nor showed any signs of distress during injection. 

## 2020-06-13 DIAGNOSIS — M48061 Spinal stenosis, lumbar region without neurogenic claudication: Secondary | ICD-10-CM | POA: Diagnosis not present

## 2020-07-01 ENCOUNTER — Other Ambulatory Visit: Payer: Self-pay | Admitting: Internal Medicine

## 2020-07-01 DIAGNOSIS — E119 Type 2 diabetes mellitus without complications: Secondary | ICD-10-CM

## 2020-07-10 ENCOUNTER — Other Ambulatory Visit: Payer: Self-pay

## 2020-07-10 ENCOUNTER — Ambulatory Visit (INDEPENDENT_AMBULATORY_CARE_PROVIDER_SITE_OTHER): Payer: Medicare Other

## 2020-07-10 DIAGNOSIS — E538 Deficiency of other specified B group vitamins: Secondary | ICD-10-CM | POA: Diagnosis not present

## 2020-07-10 MED ORDER — CYANOCOBALAMIN 1000 MCG/ML IJ SOLN
1000.0000 ug | Freq: Once | INTRAMUSCULAR | Status: AC
  Administered 2020-07-10: 1000 ug via INTRAMUSCULAR

## 2020-07-10 NOTE — Progress Notes (Signed)
Patient presented for B 12 injection to left deltoid, patient voiced no concerns nor showed any signs of distress during injection. 

## 2020-08-22 ENCOUNTER — Ambulatory Visit (INDEPENDENT_AMBULATORY_CARE_PROVIDER_SITE_OTHER): Payer: Medicare Other | Admitting: Internal Medicine

## 2020-08-22 ENCOUNTER — Encounter: Payer: Self-pay | Admitting: Internal Medicine

## 2020-08-22 ENCOUNTER — Other Ambulatory Visit: Payer: Self-pay

## 2020-08-22 VITALS — BP 144/72 | HR 61 | Temp 98.2°F | Ht 64.0 in | Wt 229.6 lb

## 2020-08-22 DIAGNOSIS — E1159 Type 2 diabetes mellitus with other circulatory complications: Secondary | ICD-10-CM

## 2020-08-22 DIAGNOSIS — M5441 Lumbago with sciatica, right side: Secondary | ICD-10-CM | POA: Diagnosis not present

## 2020-08-22 DIAGNOSIS — I152 Hypertension secondary to endocrine disorders: Secondary | ICD-10-CM | POA: Diagnosis not present

## 2020-08-22 DIAGNOSIS — M5442 Lumbago with sciatica, left side: Secondary | ICD-10-CM

## 2020-08-22 DIAGNOSIS — E538 Deficiency of other specified B group vitamins: Secondary | ICD-10-CM | POA: Diagnosis not present

## 2020-08-22 DIAGNOSIS — E119 Type 2 diabetes mellitus without complications: Secondary | ICD-10-CM | POA: Diagnosis not present

## 2020-08-22 DIAGNOSIS — E559 Vitamin D deficiency, unspecified: Secondary | ICD-10-CM

## 2020-08-22 DIAGNOSIS — E611 Iron deficiency: Secondary | ICD-10-CM

## 2020-08-22 DIAGNOSIS — G8929 Other chronic pain: Secondary | ICD-10-CM | POA: Diagnosis not present

## 2020-08-22 LAB — COMPREHENSIVE METABOLIC PANEL
ALT: 17 U/L (ref 0–35)
AST: 12 U/L (ref 0–37)
Albumin: 4.4 g/dL (ref 3.5–5.2)
Alkaline Phosphatase: 106 U/L (ref 39–117)
BUN: 26 mg/dL — ABNORMAL HIGH (ref 6–23)
CO2: 26 mEq/L (ref 19–32)
Calcium: 9.5 mg/dL (ref 8.4–10.5)
Chloride: 105 mEq/L (ref 96–112)
Creatinine, Ser: 1.1 mg/dL (ref 0.40–1.20)
GFR: 51.33 mL/min — ABNORMAL LOW (ref >60.00)
Glucose, Bld: 120 mg/dL — ABNORMAL HIGH (ref 70–99)
Potassium: 4.3 mEq/L (ref 3.5–5.1)
Sodium: 141 mEq/L (ref 135–145)
Total Bilirubin: 0.5 mg/dL (ref 0.2–1.2)
Total Protein: 7.9 g/dL (ref 6.0–8.3)

## 2020-08-22 LAB — LIPID PANEL
Cholesterol: 136 mg/dL (ref 0–200)
HDL: 40.9 mg/dL (ref >39.00)
LDL Cholesterol: 79 mg/dL (ref 0–99)
NonHDL: 94.6
Total CHOL/HDL Ratio: 3
Triglycerides: 76 mg/dL (ref 0.0–149.0)
VLDL: 15.2 mg/dL (ref 0.0–40.0)

## 2020-08-22 LAB — CBC WITH DIFFERENTIAL/PLATELET
Basophils Absolute: 0 10*3/uL (ref 0.0–0.1)
Basophils Relative: 0.2 % (ref 0.0–3.0)
Eosinophils Absolute: 0 10*3/uL (ref 0.0–0.7)
Eosinophils Relative: 0.5 % (ref 0.0–5.0)
HCT: 35.4 % — ABNORMAL LOW (ref 36.0–46.0)
Hemoglobin: 11.6 g/dL — ABNORMAL LOW (ref 12.0–15.0)
Lymphocytes Relative: 19.1 % (ref 12.0–46.0)
Lymphs Abs: 1.7 10*3/uL (ref 0.7–4.0)
MCHC: 32.7 g/dL (ref 30.0–36.0)
MCV: 88.2 fl (ref 78.0–100.0)
Monocytes Absolute: 0.6 10*3/uL (ref 0.1–1.0)
Monocytes Relative: 6.3 % (ref 3.0–12.0)
Neutro Abs: 6.6 10*3/uL (ref 1.4–7.7)
Neutrophils Relative %: 73.9 % (ref 43.0–77.0)
Platelets: 305 10*3/uL (ref 150.0–400.0)
RBC: 4.01 Mil/uL (ref 3.87–5.11)
RDW: 14.6 % (ref 11.5–15.5)
WBC: 9 10*3/uL (ref 4.0–10.5)

## 2020-08-22 LAB — VITAMIN D 25 HYDROXY (VIT D DEFICIENCY, FRACTURES): VITD: 61.3 ng/mL (ref 30.00–100.00)

## 2020-08-22 LAB — HEMOGLOBIN A1C: Hgb A1c MFr Bld: 6.3 % (ref 4.6–6.5)

## 2020-08-22 LAB — VITAMIN B12: Vitamin B-12: >1550 pg/mL — ABNORMAL HIGH (ref 211–911)

## 2020-08-22 MED ORDER — AMLODIPINE BESYLATE 5 MG PO TABS: 5.0000 mg | ORAL_TABLET | Freq: Every day | ORAL | 3 refills | Status: DC

## 2020-08-22 MED ORDER — PRAVASTATIN SODIUM 20 MG PO TABS: 20.0000 mg | ORAL_TABLET | Freq: Every day | ORAL | 3 refills | Status: DC

## 2020-08-22 MED ORDER — "NEEDLE (DISP) 25G X 1-1/2"" MISC": 1.0000 | 3 refills | Status: DC

## 2020-08-22 MED ORDER — CYANOCOBALAMIN 1000 MCG/ML IJ SOLN: 1000.0000 ug | INTRAMUSCULAR | 3 refills | Status: DC

## 2020-08-22 MED ORDER — CYANOCOBALAMIN 1000 MCG/ML IJ SOLN
1000.0000 ug | INTRAMUSCULAR | Status: DC
  Administered 2020-08-22 – 2021-05-15 (×2): 1000 ug via INTRAMUSCULAR

## 2020-08-22 NOTE — Patient Instructions (Addendum)
Please call and schedule mammogram Call [facility] and schedule eye exam  Results for Paige Phillips, Paige Phillips [facility] (MRN [id number]) as of 08/22/2020 11:05  Ref. Range 02/22/2020 11:55  Hemoglobin A1C Latest Ref Range: 4.6 - 6.5 % 6.1    Consider prevnar vaccine then in 1 year pneumonia 23 vaccine  Consider shingles vaccine shingrix x 2 doses Space different shots out by 1 month   Let me know if B12 shot wanted at home or pharmacy   Consider lidocaine or salonpas pain patches over the counter   Monitor BP goal <130/<80

## 2020-08-22 NOTE — Progress Notes (Signed)
Chief Complaint  Patient presents with   Follow-up   B12 Injection   F/u  1. Htn elevated today did not take norvasc 5 mg qd  2.chronic back pain low back better since surgery but intermittent and numbness/tingling legs better NS Dr. Vertell Limber  3. B12 def given today    Review of Systems  Constitutional:  Negative for weight loss.  HENT:  Negative for hearing loss.   Eyes:  Negative for blurred vision.  Respiratory:  Negative for shortness of breath.   Cardiovascular:  Negative for chest pain.  Musculoskeletal:  Positive for back pain.  Skin:  Negative for rash.  Neurological:  Negative for dizziness and headaches.  Psychiatric/Behavioral:  Negative for memory loss.   Past Medical History:  Diagnosis Date   Chickenpox    DDD (degenerative disc disease), lumbar    Diabetes mellitus without complication (HCC)    GERD (gastroesophageal reflux disease)    Hypertension    Lumbar herniated disc    Spinal stenosis    Past Surgical History:  Procedure Laterality Date   ABDOMINAL HYSTERECTOMY  1983   fibroids    CARPAL TUNNEL RELEASE     rt   Family History  Problem Relation Age of Onset   Cancer Father        pancreatic cancer    Cancer Sister        breast cancer age 81s   Diabetes Sister    Cancer Brother        pancreatic cancer   Stroke Brother        died stroke   ALS Mother        68s   Social History   Socioeconomic History   Marital status: Married    Spouse name: Not on file   Number of children: Not on file   Years of education: Not on file   Highest education level: Not on file  Occupational History   Not on file  Tobacco Use   Smoking status: Never   Smokeless tobacco: Never  Vaping Use   Vaping Use: Never used  Substance and Sexual Activity   Alcohol use: Yes    Comment: social   Drug use: Never   Sexual activity: Not Currently  Other Topics Concern   Not on file  Social History Narrative   [employer] worker part time    92 th grade ed    2  kids son and daughter [name] also my patient    No guns    Wears seat belt    Safe in relationship    Never smoker    Social Determinants of Health   Financial Resource Strain: Low Risk    Difficulty of Paying Living Expenses: Not hard at all  Food Insecurity: No Food Insecurity   Worried About Charity fundraiser in the Last Year: Never true   Ran Out of Food in the Last Year: Never true  Transportation Needs: No Transportation Needs   Lack of Transportation (Medical): No   Lack of Transportation (Non-Medical): No  Physical Activity: Not on file  Stress: No Stress Concern Present   Feeling of Stress : Not at all  Social Connections: Unknown   Frequency of Communication with Friends and Family: More than three times a week   Frequency of Social Gatherings with Friends and Family: More than three times a week   Attends Religious Services: Not on file   Active Member of Clubs or Organizations: Not on  file   Attends Archivist Meetings: Not on file   Marital Status: Not on file  Intimate Partner Violence: Not At Risk   Fear of Current or Ex-Partner: No   Emotionally Abused: No   Physically Abused: No   Sexually Abused: No   Current Meds  Medication Sig   acetaminophen (TYLENOL) 650 MG CR tablet Take 1,300 mg by mouth every 8 (eight) hours as needed for pain.   Alpha-Lipoic Acid 600 MG CAPS Take 600 mg by mouth 2 (two) times daily.   Cholecalciferol (HM VITAMIN D3) 100 MCG (4000 UT) CAPS Take 4,000 Units by mouth daily.   cyanocobalamin (,VITAMIN B-12,) 1000 MCG/ML injection Inject 1 mL (1,000 mcg total) into the muscle every 30 (thirty) days.   diclofenac (VOLTAREN) 75 MG EC tablet Take 75 mg by mouth daily.   metFORMIN (GLUCOPHAGE) 500 MG tablet TAKE 1 TABLET(500 MG) BY MOUTH DAILY WITH BREAKFAST AND FOOD   methocarbamol (ROBAXIN) 500 MG tablet Take 500 mg by mouth 4 (four) times daily as needed.   NEEDLE, DISP, 25 G 25G X 1-1/2" MISC 1 Device by Does not apply route  every 30 (thirty) days.   [DISCONTINUED] amLODipine (NORVASC) 5 MG tablet Take 1 tablet (5 mg total) by mouth daily.   [DISCONTINUED] pravastatin (PRAVACHOL) 20 MG tablet Take 1 tablet (20 mg total) by mouth at bedtime.   Current Facility-Administered Medications for the 08/22/20 encounter (Office Visit) with McLean-Scocuzza, Nino Glow, MD  Medication   cyanocobalamin ((VITAMIN B-12)) injection 1,000 mcg   Allergies  Allergen Reactions   Losartan     High potassium  AKI   No results found for this or any previous visit (from the past 2160 hour(s)). Objective  Body mass index is 39.41 kg/m. Wt Readings from Last 3 Encounters:  08/22/20 229 lb 9.6 oz (104.1 kg)  04/16/20 233 lb (105.7 kg)  02/22/20 233 lb (105.7 kg)   Temp Readings from Last 3 Encounters:  08/22/20 98.2 F (36.8 C) (Oral)  02/22/20 98.3 F (36.8 C) (Oral)  11/30/19 98.1 F (36.7 C) (Oral)   BP Readings from Last 3 Encounters:  08/22/20 (!) 144/72  02/22/20 (!) 150/70  11/30/19 (!) 114/46   Pulse Readings from Last 3 Encounters:  08/22/20 61  02/22/20 92  11/30/19 89    Physical Exam Vitals and nursing note reviewed.  Constitutional:      Appearance: Normal appearance. She is well-developed and well-groomed. She is obese.  HENT:     Head: Normocephalic and atraumatic.  Eyes:     Conjunctiva/sclera: Conjunctivae normal.     Pupils: Pupils are equal, round, and reactive to light.  Cardiovascular:     Rate and Rhythm: Normal rate and regular rhythm.     Heart sounds: Normal heart sounds. No murmur heard. Pulmonary:     Effort: Pulmonary effort is normal.     Breath sounds: Normal breath sounds.  Abdominal:     General: Abdomen is flat. Bowel sounds are normal.     Tenderness: There is no abdominal tenderness.  Skin:    General: Skin is warm and moist.  Neurological:     General: No focal deficit present.     Mental Status: She is alert and oriented to person, place, and time. Mental status is at  baseline.     Gait: Gait normal.  Psychiatric:        Attention and Perception: Attention and perception normal.        Mood  and Affect: Mood and affect normal.        Speech: Speech normal.        Behavior: Behavior normal. Behavior is cooperative.        Thought Content: Thought content normal.        Cognition and Memory: Cognition and memory normal.        Judgment: Judgment normal.    Assessment  Plan  Hypertension associated with diabetes (HCC) -  Last A1c 6.1  Plan: amLODipine (NORVASC) 5 MG tablet not had today so BP elevated  Comprehensive metabolic panel, Lipid panel, CBC with Differential/Platelet, Hemoglobin A1c  B12 deficiency - Plan: cyanocobalamin ((VITAMIN B-12)) injection 1,000 mcg, Vitamin B12 Given today will start at home  Type 2 diabetes mellitus without complication, without long-term current use of insulin (HCC) - Plan: pravastatin (PRAVACHOL) 20 MG tablet  Vitamin D deficiency - Plan: VITAMIN D 25 Hydroxy (Vit-D Deficiency, Fractures)  Iron deficiency - Plan: Iron, TIBC and Ferritin Panel  Chronic midline low back pain with bilateral sciatica  Fu NS Dr. Vertell Limber   HM Flu shot utd g declinesTdap covid 3/3  consider moderna think about 4th booster  Disc shingles.  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 Michail Sermon -call to see when due  Likely in 2022 max 1 year Sch week on 10/05/20     Pap s/p hysterectomy no h/o abnormal pap last 6-7 years ago ovaries intact Never smoker    rec healthy diet and exercise    Provider: Dr. [name] McLean-Scocuzza-Internal Medicine

## 2020-08-23 ENCOUNTER — Encounter: Payer: Self-pay | Admitting: Internal Medicine

## 2020-08-23 DIAGNOSIS — D509 Iron deficiency anemia, unspecified: Secondary | ICD-10-CM | POA: Insufficient documentation

## 2020-08-23 LAB — IRON,TIBC AND FERRITIN PANEL
%SAT: 10 % (calc) — ABNORMAL LOW (ref 16–45)
Ferritin: 35 ng/mL (ref 16–288)
Iron: 29 ug/dL — ABNORMAL LOW (ref 45–160)
TIBC: 302 mcg/dL (calc) (ref 250–450)

## 2020-08-24 MED ORDER — "SYRINGE/NEEDLE (DISP) 25G X 1"" 3 ML MISC": 1.0000 | 1 refills | Status: DC

## 2020-08-24 NOTE — Addendum Note (Signed)
Addended by: Elpidio Galea T on: 08/24/2020 12:58 PM   Modules accepted: Orders

## 2020-08-27 NOTE — Addendum Note (Signed)
Addended by: Orland Mustard on: 08/27/2020 11:06 AM   Modules accepted: Orders

## 2020-08-29 DIAGNOSIS — M48061 Spinal stenosis, lumbar region without neurogenic claudication: Secondary | ICD-10-CM | POA: Diagnosis not present

## 2020-08-29 DIAGNOSIS — M545 Low back pain, unspecified: Secondary | ICD-10-CM | POA: Diagnosis not present

## 2020-08-29 DIAGNOSIS — I10 Essential (primary) hypertension: Secondary | ICD-10-CM | POA: Diagnosis not present

## 2020-10-05 DIAGNOSIS — K640 First degree hemorrhoids: Secondary | ICD-10-CM | POA: Diagnosis not present

## 2020-10-05 DIAGNOSIS — Z86010 Personal history of colonic polyps: Secondary | ICD-10-CM | POA: Diagnosis not present

## 2020-10-05 DIAGNOSIS — K259 Gastric ulcer, unspecified as acute or chronic, without hemorrhage or perforation: Secondary | ICD-10-CM | POA: Diagnosis not present

## 2020-10-05 DIAGNOSIS — K635 Polyp of colon: Secondary | ICD-10-CM | POA: Diagnosis not present

## 2020-10-05 DIAGNOSIS — K3189 Other diseases of stomach and duodenum: Secondary | ICD-10-CM | POA: Diagnosis not present

## 2020-10-05 DIAGNOSIS — D509 Iron deficiency anemia, unspecified: Secondary | ICD-10-CM | POA: Diagnosis not present

## 2020-10-05 DIAGNOSIS — K319 Disease of stomach and duodenum, unspecified: Secondary | ICD-10-CM | POA: Diagnosis not present

## 2020-10-05 LAB — HM COLONOSCOPY

## 2020-10-09 DIAGNOSIS — K319 Disease of stomach and duodenum, unspecified: Secondary | ICD-10-CM | POA: Diagnosis not present

## 2020-10-09 DIAGNOSIS — K635 Polyp of colon: Secondary | ICD-10-CM | POA: Diagnosis not present

## 2020-10-17 ENCOUNTER — Encounter: Payer: Self-pay | Admitting: Internal Medicine

## 2020-11-05 ENCOUNTER — Encounter: Payer: Medicare Other | Attending: Internal Medicine | Admitting: Dietician

## 2020-11-05 ENCOUNTER — Other Ambulatory Visit: Payer: Self-pay

## 2020-11-05 ENCOUNTER — Encounter: Payer: Self-pay | Admitting: Dietician

## 2020-11-05 VITALS — Ht 64.0 in | Wt 234.2 lb

## 2020-11-05 DIAGNOSIS — E119 Type 2 diabetes mellitus without complications: Secondary | ICD-10-CM | POA: Diagnosis not present

## 2020-11-05 NOTE — Patient Instructions (Signed)
Aim to drink 2 bottles of water per day. Try to keep a bottle with you at all times.  Work on lowering your Pepsi consumption each day. Start by replacing 1-2 Pepsis with a Pepsi Zero each day and continue to increase the amount of regular Pepsi that you substitute with Pepsi Zero and water.  Try Smart Balance butter for a lower saturated fat butter option.  Work towards eating three meals a day, about 5-6 hours apart!  Begin to recognize carbohydrates in your food choices!  Begin to build your meals using the proportions of the Balanced Plate. First, select your carb choice(s) for the meal. Next, select your source of protein to pair with your carb choice(s). Finally, complete the remaining half of your meal with a variety of non-starchy vegetables.

## 2020-11-05 NOTE — Progress Notes (Signed)
Diabetes Self-Management Education  Visit Type: First/Initial  Appt. Start Time: 1400 Appt. End Time: 1500  11/05/2020  [name], identified by name and date of birth, is a 70 y.o. female with a diagnosis of Diabetes: Type 2.   ASSESSMENT  Pt is currently taking metformin for their diabetes, has been since 2019. Pt has history of HTN that is mostly controlled via medication. Pt also has history of B12, and Vitamin D deficiencies that are currently rectified by supplementation.  Pt states they would like to eat healthier. Pt reports eating a lot of bread, likes all kinds. Pt reports that Pepsi is a weakness of theirs, usually drink 6-8 12 oz cans a day. Pt states that they do not drink enough water. Pt does not eat cheese. Pt reports usually missing lunch, states they aren't usually hungry. Pt walks around their neighborhood a couple times a week for activity. Pt also likes to dance.  Height '5\' 4"'$  (1.626 m), weight 234 lb 3.2 oz (106.2 kg). Body mass index is 40.2 kg/m.   Diabetes Self-Management Education - 11/05/20 1415       Visit Information   Visit Type First/Initial      Initial Visit   Diabetes Type Type 2    Are you currently following a meal plan? No    Are you taking your medications as prescribed? Yes    Date Diagnosed 2019      Health Coping   How would you rate your overall health? Fair      Psychosocial Assessment   Patient Belief/Attitude about Diabetes Motivated to manage diabetes    Self-care barriers None    Self-management support Doctor's office    Other persons present Patient    Patient Concerns Nutrition/Meal planning    Learning Readiness Ready    How often do you need to have someone help you when you read instructions, pamphlets, or other written materials from your doctor or pharmacy? 1 - Never    What is the last grade level you completed in school? 12th grade      Pre-Education Assessment   Patient understands the diabetes disease and  treatment process. Needs Instruction    Patient understands incorporating nutritional management into lifestyle. Needs Instruction    Patient undertands incorporating physical activity into lifestyle. Needs Instruction    Patient understands using medications safely. Needs Instruction    Patient understands monitoring blood glucose, interpreting and using results Needs Instruction    Patient understands prevention, detection, and treatment of acute complications. Needs Instruction    Patient understands prevention, detection, and treatment of chronic complications. Needs Instruction    Patient understands how to develop strategies to address psychosocial issues. Needs Instruction    Patient understands how to develop strategies to promote health/change behavior. Needs Instruction      Complications   Last HgB A1C per patient/outside source 6.3 %   08/30/2020   How often do you check your blood sugar? 0 times/day (not testing)    Have you had a dilated eye exam in the past 12 months? No    Have you had a dental exam in the past 12 months? No    Are you checking your feet? Yes    How many days per week are you checking your feet? 7      Dietary Intake   Breakfast [place] pork chop biscuit, Pepsi    Snack (morning) none    Lunch none    Snack (afternoon) none  Dinner [employer] boil w/ lobster tail, potatoes, ear of corn, Pepsi    Snack (evening) none    Beverage(s) Pepsi      Exercise   Exercise Type ADL's;Light (walking / raking leaves)    How many days per week to you exercise? 2    How many minutes per day do you exercise? 30    Total minutes per week of exercise 60      Patient Education   Previous Diabetes Education No    Disease state  Factors that contribute to the development of diabetes;Definition of diabetes, type 1 and 2, and the diagnosis of diabetes;Explored patient's options for treatment of their diabetes    Nutrition management  Food label reading, portion sizes  and measuring food.;Role of diet in the treatment of diabetes and the relationship between the three main macronutrients and blood glucose level;Meal options for control of blood glucose level and chronic complications.    Physical activity and exercise  Role of exercise on diabetes management, blood pressure control and cardiac health.    Medications Reviewed patients medication for diabetes, action, purpose, timing of dose and side effects.    Chronic complications Relationship between chronic complications and blood glucose control    Personal strategies to promote health Other (comment)   Consitent meal timing, and lowering SSB consumtpion     Individualized Goals (developed by patient)   Nutrition Follow meal plan discussed;General guidelines for healthy choices and portions discussed    Physical Activity Exercise 1-2 times per week    Medications take my medication as prescribed    Monitoring  Not Applicable      Post-Education Assessment   Patient understands the diabetes disease and treatment process. Needs Review    Patient understands incorporating nutritional management into lifestyle. Needs Review    Patient undertands incorporating physical activity into lifestyle. Needs Review    Patient understands using medications safely. Needs Review    Patient understands monitoring blood glucose, interpreting and using results Needs Review    Patient understands prevention, detection, and treatment of acute complications. Needs Review    Patient understands prevention, detection, and treatment of chronic complications. Needs Review    Patient understands how to develop strategies to address psychosocial issues. Needs Review    Patient understands how to develop strategies to promote health/change behavior. Needs Review      Outcomes   Expected Outcomes Demonstrated interest in learning. Expect positive outcomes    Future DMSE 3-4 months    Program Status Not Completed              Individualized Plan for Diabetes Self-Management Training:   Learning Objective:  Patient will have a greater understanding of diabetes self-management. Patient education plan is to attend individual and/or group sessions per assessed needs and concerns.   Plan:   Patient Instructions  Aim to drink 2 bottles of water per day. Try to keep a bottle with you at all times.  Work on lowering your Pepsi consumption each day. Start by replacing 1-2 Pepsis with a Pepsi Zero each day and continue to increase the amount of regular Pepsi that you substitute with Pepsi Zero and water.  Try Smart Balance butter for a lower saturated fat butter option.  Work towards eating three meals a day, about 5-6 hours apart!  Begin to recognize carbohydrates in your food choices!  Begin to build your meals using the proportions of the Balanced Plate. First, select your carb choice(s) for the  meal. Next, select your source of protein to pair with your carb choice(s). Finally, complete the remaining half of your meal with a variety of non-starchy vegetables.   Expected Outcomes:  Demonstrated interest in learning. Expect positive outcomes  Education material provided: My Plate and Snack sheet  If problems or questions, patient to contact team via:  Phone and Email  Future DSME appointment: 3-4 months

## 2020-12-12 ENCOUNTER — Telehealth: Payer: Self-pay | Admitting: Internal Medicine

## 2020-12-12 NOTE — Telephone Encounter (Signed)
Patient calling in and states she needs a referral to [facility] doctor Dr Katy Fitch in [place] for a diabetic eye exam.   States that Diabetic eye exams are covered under medical and per insurance she has to have a referral from PCP office. Okay for referral?   If yes I can place the referral.

## 2020-12-13 ENCOUNTER — Other Ambulatory Visit: Payer: Self-pay | Admitting: Family

## 2020-12-13 DIAGNOSIS — E119 Type 2 diabetes mellitus without complications: Secondary | ICD-10-CM

## 2021-02-05 ENCOUNTER — Ambulatory Visit: Payer: Medicare Other | Admitting: Dietician

## 2021-02-17 ENCOUNTER — Other Ambulatory Visit: Payer: Self-pay | Admitting: Internal Medicine

## 2021-02-17 DIAGNOSIS — E1159 Type 2 diabetes mellitus with other circulatory complications: Secondary | ICD-10-CM

## 2021-03-14 DIAGNOSIS — H40033 Anatomical narrow angle, bilateral: Secondary | ICD-10-CM | POA: Diagnosis not present

## 2021-03-14 DIAGNOSIS — H25812 Combined forms of age-related cataract, left eye: Secondary | ICD-10-CM | POA: Diagnosis not present

## 2021-03-14 LAB — HM DIABETES EYE EXAM

## 2021-04-17 ENCOUNTER — Ambulatory Visit (INDEPENDENT_AMBULATORY_CARE_PROVIDER_SITE_OTHER): Payer: Medicare Other

## 2021-04-17 VITALS — Ht 64.0 in | Wt 234.0 lb

## 2021-04-17 DIAGNOSIS — Z0000 Encounter for general adult medical examination without abnormal findings: Secondary | ICD-10-CM

## 2021-04-17 NOTE — Patient Instructions (Addendum)
Paige Phillips , Thank you for taking time to come for your Medicare Wellness Visit. I appreciate your ongoing commitment to your health goals. Please review the following plan we discussed and let me know if I can assist you in the future.   These are the goals we discussed:  Goals      Maintain Healthy Lifestyle     Stay active Healthy diet Good water intake        This is a list of the screening recommended for you and due dates:  Health Maintenance  Topic Date Due   Eye exam for diabetics  Never done   COVID-19 Vaccine (4 - Booster) 05/03/2021*   Pneumonia Vaccine (1 - PCV) 05/14/2021*   Complete foot exam   05/14/2021*   Hemoglobin A1C  05/14/2021*   Urine Protein Check  05/15/2021*   Zoster (Shingles) Vaccine (1 of 2) 07/15/2021*   Tetanus Vaccine  04/17/2022*   Mammogram  04/21/2021   Colon Cancer Screening  10/06/2030   DEXA scan (bone density measurement)  Completed   Hepatitis C Screening: USPSTF Recommendation to screen - Ages 70-79 yo.  Completed   HPV Vaccine  Aged Out   Flu Shot  Discontinued  *Topic was postponed. The date shown is not the original due date.    Advanced directives: not yet completed  Conditions/risks identified: none new  Follow up in one year for your annual wellness visit    Preventive Care 70 Years and Older, Female Preventive care refers to lifestyle choices and visits with your health care provider that can promote health and wellness. What does preventive care include? A yearly physical exam. This is also called an annual well check. Dental exams once or twice a year. Routine eye exams. Ask your health care provider how often you should have your eyes checked. Personal lifestyle choices, including: Daily care of your teeth and gums. Regular physical activity. Eating a healthy diet. Avoiding tobacco and drug use. Limiting alcohol use. Practicing safe sex. Taking low-dose aspirin every day. Taking vitamin and mineral supplements  as recommended by your health care provider. What happens during an annual well check? The services and screenings done by your health care provider during your annual well check will depend on your age, overall health, lifestyle risk factors, and family history of disease. Counseling  Your health care provider may ask you questions about your: Alcohol use. Tobacco use. Drug use. Emotional well-being. Home and relationship well-being. Sexual activity. Eating habits. History of falls. Memory and ability to understand (cognition). Work and work Statistician. Reproductive health. Screening  You may have the following tests or measurements: Height, weight, and BMI. Blood pressure. Lipid and cholesterol levels. These may be checked every 5 years, or more frequently if you are over 18 years old. Skin check. Lung cancer screening. You may have this screening every year starting at age 50 if you have a 30-pack-year history of smoking and currently smoke or have quit within the past 15 years. Fecal occult blood test (FOBT) of the stool. You may have this test every year starting at age 9. Flexible sigmoidoscopy or colonoscopy. You may have a sigmoidoscopy every 5 years or a colonoscopy every 10 years starting at age 6. Hepatitis C blood test. Hepatitis B blood test. Sexually transmitted disease (STD) testing. Diabetes screening. This is done by checking your blood sugar (glucose) after you have not eaten for a while (fasting). You may have this done every 1-3 years. Bone density scan. This  is done to screen for osteoporosis. You may have this done starting at age 66. Mammogram. This may be done every 1-2 years. Talk to your health care provider about how often you should have regular mammograms. Talk with your health care provider about your test results, treatment options, and if necessary, the need for more tests. Vaccines  Your health care provider may recommend certain vaccines, such  as: Influenza vaccine. This is recommended every year. Tetanus, diphtheria, and acellular pertussis (Tdap, Td) vaccine. You may need a Td booster every 10 years. Zoster vaccine. You may need this after age 70. Pneumococcal 13-valent conjugate (PCV13) vaccine. One dose is recommended after age 70. Pneumococcal polysaccharide (PPSV23) vaccine. One dose is recommended after age 70. Talk to your health care provider about which screenings and vaccines you need and how often you need them. This information is not intended to replace advice given to you by your health care provider. Make sure you discuss any questions you have with your health care provider. Document Released: 03/23/2015 Document Revised: 11/14/2015 Document Reviewed: 12/26/2014 Elsevier Interactive Patient Education  2017 [employer] Prevention in the Home Falls can cause injuries. They can happen to people of all ages. There are many things you can do to make your home safe and to help prevent falls. What can I do on the outside of my home? Regularly fix the edges of walkways and driveways and fix any cracks. Remove anything that might make you trip as you walk through a door, such as a raised step or threshold. Trim any bushes or trees on the path to your home. Use bright outdoor lighting. Clear any walking paths of anything that might make someone trip, such as rocks or tools. Regularly check to see if handrails are loose or broken. Make sure that both sides of any steps have handrails. Any raised decks and porches should have guardrails on the edges. Have any leaves, snow, or ice cleared regularly. Use sand or salt on walking paths during winter. Clean up any spills in your garage right away. This includes oil or grease spills. What can I do in the bathroom? Use night lights. Install grab bars by the toilet and in the tub and shower. Do not use towel bars as grab bars. Use non-skid mats or decals in the tub or  shower. If you need to sit down in the shower, use a plastic, non-slip stool. Keep the floor dry. Clean up any water that spills on the floor as soon as it happens. Remove soap buildup in the tub or shower regularly. Attach bath mats securely with double-sided non-slip rug tape. Do not have throw rugs and other things on the floor that can make you trip. What can I do in the bedroom? Use night lights. Make sure that you have a light by your bed that is easy to reach. Do not use any sheets or blankets that are too big for your bed. They should not hang down onto the floor. Have a firm chair that has side arms. You can use this for support while you get dressed. Do not have throw rugs and other things on the floor that can make you trip. What can I do in the kitchen? Clean up any spills right away. Avoid walking on wet floors. Keep items that you use a lot in easy-to-reach places. If you need to reach something above you, use a strong step stool that has a grab bar. Keep electrical cords out  of the way. Do not use floor polish or wax that makes floors slippery. If you must use wax, use non-skid floor wax. Do not have throw rugs and other things on the floor that can make you trip. What can I do with my stairs? Do not leave any items on the stairs. Make sure that there are handrails on both sides of the stairs and use them. Fix handrails that are broken or loose. Make sure that handrails are as long as the stairways. Check any carpeting to make sure that it is firmly attached to the stairs. Fix any carpet that is loose or worn. Avoid having throw rugs at the top or bottom of the stairs. If you do have throw rugs, attach them to the floor with carpet tape. Make sure that you have a light switch at the top of the stairs and the bottom of the stairs. If you do not have them, ask someone to add them for you. What else can I do to help prevent falls? Wear shoes that: Do not have high heels. Have  rubber bottoms. Are comfortable and fit you well. Are closed at the toe. Do not wear sandals. If you use a stepladder: Make sure that it is fully opened. Do not climb a closed stepladder. Make sure that both sides of the stepladder are locked into place. Ask someone to hold it for you, if possible. Clearly mark and make sure that you can see: Any grab bars or handrails. First and last steps. Where the edge of each step is. Use tools that help you move around (mobility aids) if they are needed. These include: Canes. Walkers. Scooters. Crutches. Turn on the lights when you go into a dark area. Replace any light bulbs as soon as they burn out. Set up your furniture so you have a clear path. Avoid moving your furniture around. If any of your floors are uneven, fix them. If there are any pets around you, be aware of where they are. Review your medicines with your doctor. Some medicines can make you feel dizzy. This can increase your chance of falling. Ask your doctor what other things that you can do to help prevent falls. This information is not intended to replace advice given to you by your health care provider. Make sure you discuss any questions you have with your health care provider. Document Released: 12/21/2008 Document Revised: 08/02/2015 Document Reviewed: 03/31/2014 Elsevier Interactive Patient Education  2017 [employer].

## 2021-04-17 NOTE — Progress Notes (Signed)
Subjective:   Paige Phillips is a 71 y.o. female who presents for Medicare Annual (Subsequent) preventive examination.  Review of Systems    No ROS.  Medicare Wellness Virtual Visit.  Visual/audio telehealth visit, UTA vital signs.   See social history for additional risk factors.   Cardiac Risk Factors include: advanced age (>1men, >26 women);diabetes mellitus;hypertension     Objective:    Today's Vitals   04/17/21 1232  Weight: 234 lb (106.1 kg)  Height: 5\' 4"  (1.626 m)   Body mass index is 40.17 kg/m.  Advanced Directives 04/17/2021 11/05/2020 04/16/2020 11/24/2019  Does Patient Have a Medical Advance Directive? No No No No  Would patient like information on creating a medical advance directive? No - Patient declined Yes (MAU/Ambulatory/Procedural Areas - Information given) No - Patient declined -    Current Medications (verified) Outpatient Encounter Medications as of 04/17/2021  Medication Sig   acetaminophen (TYLENOL) 650 MG CR tablet Take 1,300 mg by mouth every 8 (eight) hours as needed for pain.   Alpha-Lipoic Acid 600 MG CAPS Take 600 mg by mouth 2 (two) times daily.   amLODipine (NORVASC) 5 MG tablet TAKE 1 TABLET(5 MG) BY MOUTH DAILY   Cholecalciferol (HM VITAMIN D3) 100 MCG (4000 UT) CAPS Take 4,000 Units by mouth daily.   cyanocobalamin (,VITAMIN B-12,) 1000 MCG/ML injection Inject 1 mL (1,000 mcg total) into the muscle every 30 (thirty) days.   diclofenac (VOLTAREN) 75 MG EC tablet Take 75 mg by mouth daily.   metFORMIN (GLUCOPHAGE) 500 MG tablet TAKE 1 TABLET(500 MG) BY MOUTH DAILY WITH BREAKFAST AND FOOD   methocarbamol (ROBAXIN) 500 MG tablet Take 500 mg by mouth 4 (four) times daily as needed.   NEEDLE, DISP, 25 G 25G X 1-1/2" MISC 1 Device by Does not apply route every 30 (thirty) days.   pravastatin (PRAVACHOL) 20 MG tablet Take 1 tablet (20 mg total) by mouth at bedtime.   SYRINGE-NEEDLE, DISP, 3 ML 25G X 1" 3 ML MISC 1 Stick by Does not apply route  every 30 (thirty) days.   Facility-Administered Encounter Medications as of 04/17/2021  Medication   cyanocobalamin ((VITAMIN B-12)) injection 1,000 mcg    Allergies (verified) Losartan   History: Past Medical History:  Diagnosis Date   Chickenpox    DDD (degenerative disc disease), lumbar    Diabetes mellitus without complication (HCC)    GERD (gastroesophageal reflux disease)    Hypertension    Lumbar herniated disc    Spinal stenosis    Past Surgical History:  Procedure Laterality Date   ABDOMINAL HYSTERECTOMY  1983   fibroids    CARPAL TUNNEL RELEASE     rt   Family History  Problem Relation Age of Onset   Cancer Father        pancreatic cancer    Cancer Sister        breast cancer age 66s   Diabetes Sister    Cancer Brother        pancreatic cancer   Stroke Brother        died stroke   ALS Mother        47s   Social History   Socioeconomic History   Marital status: Single    Spouse name: Not on file   Number of children: Not on file   Years of education: Not on file   Highest education level: Not on file  Occupational History   Not on file  Tobacco Use  Smoking status: Never   Smokeless tobacco: Never  Vaping Use   Vaping Use: Never used  Substance and Sexual Activity   Alcohol use: Yes    Comment: social   Drug use: Never   Sexual activity: Not Currently  Other Topics Concern   Not on file  Social History Narrative   [employer] worker part time    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    2 kids son and daughter Paige Phillips also my patient    No guns    Wears seat belt    Safe in relationship    Never smoker    Social Determinants of Health   Financial Resource Strain: Low Risk    Difficulty of Paying Living Expenses: Not hard at all  Food Insecurity: No Food Insecurity   Worried About Charity fundraiser in the Last Year: Never true   [place] in the Last Year: Never true  Transportation Needs: No Transportation Needs   Lack of Transportation (Medical):  No   Lack of Transportation (Non-Medical): No  Physical Activity: Not on file  Stress: No Stress Concern Present   Feeling of Stress : Not at all  Social Connections: Unknown   Frequency of Communication with Friends and Family: More than three times a week   Frequency of Social Gatherings with Friends and Family: More than three times a week   Attends Religious Services: Not on Electrical engineer or Organizations: Not on file   Attends Archivist Meetings: Not on file   Marital Status: Not on file    Tobacco Counseling Counseling given: Not Answered   Clinical Intake:  Pre-visit preparation completed: Yes        Diabetes: Yes (Followed by PCP)  How often do you need to have someone help you when you read instructions, pamphlets, or other written materials from your doctor or pharmacy?: 1 - Never Nutrition Risk Assessment: Does the patient have any non-healing wounds?  No   Financial Strains and Diabetes Management: Are you having any financial strains with the device, your supplies or your medication? No .   Does the patient want to be seen by Chronic Care Management for management of their diabetes?  No   Would the patient like to be referred to a Nutritionist or for Diabetic Management?  No    Interpreter Needed?: No      Activities of Daily Living In your present state of health, do you have any difficulty performing the following activities: 04/17/2021  Hearing? N  Vision? N  Difficulty concentrating or making decisions? N  Walking or climbing stairs? N  Dressing or bathing? N  Doing errands, shopping? N  Preparing Food and eating ? N  Using the Toilet? N  In the past six months, have you accidently leaked urine? N  Do you have problems with loss of bowel control? N  Managing your Medications? N  Managing your Finances? N  Housekeeping or managing your Housekeeping? N  Some recent data might be hidden    Patient Care  Team: McLean-Scocuzza, Nino Glow, MD as PCP - General (Internal Medicine)  Indicate any recent Medical Services you may have received from other than [facility] providers in the past year (date may be approximate).     Assessment:   This is a routine wellness examination for Paige Phillips.  Hearing/Vision screen Hearing Screening - Comments:: Patient is able to hear conversational tones without difficulty. No issues reported. Vision  Screening - Comments:: They have seen their ophthalmologist in the last 12 months.    Dietary issues and exercise activities discussed: Current Exercise Habits: Home exercise routine, Intensity: Mild   Goals Addressed   None    Depression Screen PHQ 2/9 Scores 04/17/2021 11/05/2020 04/16/2020 02/18/2019 12/25/2017  PHQ - 2 Score 0 0 0 0 0    Fall Risk Fall Risk  04/17/2021 11/05/2020 08/22/2020 04/16/2020 02/22/2020  Falls in the past year? 0 0 0 0 0  Number falls in past yr: 0 - 0 0 0  Injury with Fall? - - 0 0 0  Follow up Falls evaluation completed - Falls evaluation completed Falls evaluation completed Falls evaluation completed    [place]: Home free of loose throw rugs in walkways, pet beds, electrical cords, etc? Yes  Adequate lighting in your home to reduce risk of falls? Yes   ASSISTIVE DEVICES UTILIZED TO PREVENT FALLS: Life alert? No  Use of a cane, walker or w/c? No   TIMED UP AND GO: Was the test performed? No .   Cognitive Function:  Patient is alert and oriented x3.       Immunizations Immunization History  Administered Date(s) Administered   Fluad Quad(high Dose 65+) 02/22/2020   Moderna Sars-Covid-2 Vaccination 04/23/2019, 05/12/2019, 02/22/2020   TDAP status: Due, Education has been provided regarding the importance of this vaccine. Advised may receive this vaccine at local pharmacy or Health Dept. Aware to provide a copy of the vaccination record if obtained from local pharmacy or Health Dept. Verbalized  acceptance and understanding. Deferred.   Pneumococcal vaccine status: Due, Education has been provided regarding the importance of this vaccine. Advised may receive this vaccine at local pharmacy or Health Dept. Aware to provide a copy of the vaccination record if obtained from local pharmacy or Health Dept. Verbalized acceptance and understanding.  Shingrix Completed?: No.    Education has been provided regarding the importance of this vaccine. Patient has been advised to call insurance company to determine out of pocket expense if they have not yet received this vaccine. Advised may also receive vaccine at local pharmacy or Health Dept. Verbalized acceptance and understanding.  Screening Tests Health Maintenance  Topic Date Due   OPHTHALMOLOGY EXAM  Never done   COVID-19 Vaccine (4 - Booster) 05/03/2021 (Originally 04/18/2020)   Pneumonia Vaccine 57+ Years old (1 - PCV) 05/14/2021 (Originally 04/23/2016)   FOOT EXAM  05/14/2021 (Originally 08/22/2020)   HEMOGLOBIN A1C  05/14/2021 (Originally 02/21/2021)   URINE MICROALBUMIN  05/15/2021 (Originally 02/21/2021)   Zoster Vaccines- Shingrix (1 of 2) 07/15/2021 (Originally 04/23/2001)   TETANUS/TDAP  04/17/2022 (Originally 04/23/1970)   MAMMOGRAM  04/21/2021   COLONOSCOPY (Pts 45-49yrs Insurance coverage will need to be confirmed)  10/06/2030   DEXA SCAN  Completed   Hepatitis C Screening  Completed   HPV VACCINES  Aged Out   INFLUENZA VACCINE  Discontinued   Health Maintenance Health Maintenance Due  Topic Date Due   OPHTHALMOLOGY EXAM  Never done   Mammogram status: Completed 04/22/19. Repeat every year. Ordered today.   Lung Cancer Screening: (Low Dose CT Chest recommended if Age 22-80 years, 30 pack-year currently smoking OR have quit w/in 15years.) does not qualify.   Vision Screening: Recommended annual ophthalmology exams for early detection of glaucoma and other disorders of the eye.  Dental Screening: Recommended annual dental  exams for proper oral hygiene  Community Resource Referral / Chronic Care Management: CRR required  this visit?  No   CCM required this visit?  No      Plan:   Keep all routine maintenance appointments.   I have personally reviewed and noted the following in the patients chart:   Medical and social history Use of alcohol, tobacco or illicit drugs  Current medications and supplements including opioid prescriptions.  Functional ability and status Nutritional status Physical activity Advanced directives List of other physicians Hospitalizations, surgeries, and ER visits in previous 12 months Vitals Screenings to include cognitive, depression, and falls Referrals and appointments  In addition, I have reviewed and discussed with patient certain preventive protocols, quality metrics, and best practice recommendations. A written personalized care plan for preventive services as well as general preventive health recommendations were provided to patient via mychart.     Varney Biles, LPN   03/13/8401

## 2021-05-15 ENCOUNTER — Ambulatory Visit (INDEPENDENT_AMBULATORY_CARE_PROVIDER_SITE_OTHER): Payer: Medicare Other | Admitting: Internal Medicine

## 2021-05-15 ENCOUNTER — Other Ambulatory Visit: Payer: Self-pay

## 2021-05-15 ENCOUNTER — Encounter: Payer: Self-pay | Admitting: Internal Medicine

## 2021-05-15 VITALS — BP 142/70 | HR 96 | Temp 98.2°F | Ht 64.0 in | Wt 231.0 lb

## 2021-05-15 DIAGNOSIS — E611 Iron deficiency: Secondary | ICD-10-CM

## 2021-05-15 DIAGNOSIS — I152 Hypertension secondary to endocrine disorders: Secondary | ICD-10-CM | POA: Diagnosis not present

## 2021-05-15 DIAGNOSIS — Z0000 Encounter for general adult medical examination without abnormal findings: Secondary | ICD-10-CM

## 2021-05-15 DIAGNOSIS — B353 Tinea pedis: Secondary | ICD-10-CM | POA: Diagnosis not present

## 2021-05-15 DIAGNOSIS — E538 Deficiency of other specified B group vitamins: Secondary | ICD-10-CM | POA: Diagnosis not present

## 2021-05-15 DIAGNOSIS — Z1329 Encounter for screening for other suspected endocrine disorder: Secondary | ICD-10-CM | POA: Diagnosis not present

## 2021-05-15 DIAGNOSIS — Z1231 Encounter for screening mammogram for malignant neoplasm of breast: Secondary | ICD-10-CM

## 2021-05-15 DIAGNOSIS — E119 Type 2 diabetes mellitus without complications: Secondary | ICD-10-CM

## 2021-05-15 DIAGNOSIS — B351 Tinea unguium: Secondary | ICD-10-CM

## 2021-05-15 DIAGNOSIS — Z23 Encounter for immunization: Secondary | ICD-10-CM

## 2021-05-15 DIAGNOSIS — Z0001 Encounter for general adult medical examination with abnormal findings: Secondary | ICD-10-CM | POA: Diagnosis not present

## 2021-05-15 DIAGNOSIS — E1159 Type 2 diabetes mellitus with other circulatory complications: Secondary | ICD-10-CM

## 2021-05-15 LAB — CBC WITH DIFFERENTIAL/PLATELET
Basophils Absolute: 0 10*3/uL (ref 0.0–0.1)
Basophils Relative: 0.4 % (ref 0.0–3.0)
Eosinophils Absolute: 0 10*3/uL (ref 0.0–0.7)
Eosinophils Relative: 0.4 % (ref 0.0–5.0)
HCT: 37.1 % (ref 36.0–46.0)
Hemoglobin: 12.1 g/dL (ref 12.0–15.0)
Lymphocytes Relative: 15.7 % (ref 12.0–46.0)
Lymphs Abs: 1.4 10*3/uL (ref 0.7–4.0)
MCHC: 32.7 g/dL (ref 30.0–36.0)
MCV: 87.9 fl (ref 78.0–100.0)
Monocytes Absolute: 0.4 10*3/uL (ref 0.1–1.0)
Monocytes Relative: 4.6 % (ref 3.0–12.0)
Neutro Abs: 7.3 10*3/uL (ref 1.4–7.7)
Neutrophils Relative %: 78.9 % — ABNORMAL HIGH (ref 43.0–77.0)
Platelets: 266 10*3/uL (ref 150.0–400.0)
RBC: 4.22 Mil/uL (ref 3.87–5.11)
RDW: 15.6 % — ABNORMAL HIGH (ref 11.5–15.5)
WBC: 9.2 10*3/uL (ref 4.0–10.5)

## 2021-05-15 LAB — COMPREHENSIVE METABOLIC PANEL
ALT: 9 U/L (ref 0–35)
AST: 13 U/L (ref 0–37)
Albumin: 4.5 g/dL (ref 3.5–5.2)
Alkaline Phosphatase: 85 U/L (ref 39–117)
BUN: 11 mg/dL (ref 6–23)
CO2: 26 mEq/L (ref 19–32)
Calcium: 9.9 mg/dL (ref 8.4–10.5)
Chloride: 101 mEq/L (ref 96–112)
Creatinine, Ser: 0.77 mg/dL (ref 0.40–1.20)
GFR: 78.35 mL/min (ref >60.00)
Glucose, Bld: 111 mg/dL — ABNORMAL HIGH (ref 70–99)
Potassium: 3.8 mEq/L (ref 3.5–5.1)
Sodium: 138 mEq/L (ref 135–145)
Total Bilirubin: 0.9 mg/dL (ref 0.2–1.2)
Total Protein: 7.9 g/dL (ref 6.0–8.3)

## 2021-05-15 LAB — LIPID PANEL
Cholesterol: 122 mg/dL (ref 0–200)
HDL: 52.2 mg/dL (ref >39.00)
LDL Cholesterol: 47 mg/dL (ref 0–99)
NonHDL: 69.52
Total CHOL/HDL Ratio: 2
Triglycerides: 111 mg/dL (ref 0.0–149.0)
VLDL: 22.2 mg/dL (ref 0.0–40.0)

## 2021-05-15 LAB — IBC + FERRITIN
Ferritin: 30.6 ng/mL (ref 10.0–291.0)
Iron: 54 ug/dL (ref 42–145)
Saturation Ratios: 15.1 % — ABNORMAL LOW (ref 20.0–50.0)
TIBC: 357 ug/dL (ref 250.0–450.0)
Transferrin: 255 mg/dL (ref 212.0–360.0)

## 2021-05-15 LAB — TSH: TSH: 0.84 u[IU]/mL (ref 0.35–5.50)

## 2021-05-15 LAB — HEMOGLOBIN A1C: Hgb A1c MFr Bld: 6.5 % (ref 4.6–6.5)

## 2021-05-15 MED ORDER — NEBIVOLOL HCL 2.5 MG PO TABS: 2.5000 mg | ORAL_TABLET | Freq: Every day | ORAL | 3 refills | Status: DC

## 2021-05-15 MED ORDER — METFORMIN HCL 500 MG PO TABS: ORAL_TABLET | ORAL | 3 refills | Status: DC

## 2021-05-15 MED ORDER — TERBINAFINE HCL 1 % EX CREA: 1.0000 "application " | TOPICAL_CREAM | Freq: Two times a day (BID) | CUTANEOUS | 11 refills | Status: DC

## 2021-05-15 MED ORDER — TERBINAFINE HCL 250 MG PO TABS: 250.0000 mg | ORAL_TABLET | Freq: Every day | ORAL | 0 refills | Status: DC

## 2021-05-15 MED ORDER — PRAVASTATIN SODIUM 20 MG PO TABS: 20.0000 mg | ORAL_TABLET | Freq: Every day | ORAL | 3 refills | Status: DC

## 2021-05-15 MED ORDER — PNEUMOCOCCAL 20-VAL CONJ VACC 0.5 ML IM SUSY: 0.5000 mL | PREFILLED_SYRINGE | INTRAMUSCULAR | 0 refills | Status: AC

## 2021-05-15 NOTE — Progress Notes (Signed)
Chief Complaint  Patient presents with   Annual Exam   Annual  1. 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 C/o toenail fungus an dnail fungus  Review of Systems  Constitutional:  Negative for weight loss.  HENT:  Negative for hearing loss.   Eyes:  Negative for blurred vision.  Respiratory:  Negative for shortness of breath.   Cardiovascular:  Negative for chest pain.  Gastrointestinal:  Negative for abdominal pain and blood in stool.  Genitourinary:  Negative for dysuria.  Musculoskeletal:  Negative for falls and joint pain.  Skin:  Negative for rash.  Neurological:  Negative for headaches.  Psychiatric/Behavioral:  Negative for depression.   Past Medical History:  Diagnosis Date   Chickenpox    DDD (degenerative disc disease), lumbar    Diabetes mellitus without complication (HCC)    GERD (gastroesophageal reflux disease)    Hypertension    Lumbar herniated disc    Spinal stenosis    Past Surgical History:  Procedure Laterality Date   ABDOMINAL HYSTERECTOMY  1983   fibroids    CARPAL TUNNEL RELEASE     rt   Family History  Problem Relation Age of Onset   Cancer Father        pancreatic cancer    Cancer Sister        breast cancer age 19s   Diabetes Sister    Cancer Brother        pancreatic cancer   Stroke Brother        died stroke   ALS Mother        89s   Social History   Socioeconomic History   Marital status: Single    Spouse name: Not on file   Number of children: Not on file   Years of education: Not on file   Highest education level: Not on file  Occupational History   Not on file  Tobacco Use   Smoking status: Never   Smokeless tobacco: Never  Vaping Use   Vaping Use: Never used  Substance and Sexual Activity   Alcohol use: Yes    Comment: social   Drug use: Never   Sexual activity: Not Currently  Other Topics Concern   Not on file  Social History Narrative    [employer] worker part time    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    2 kids son and daughter [name] also my patient    No guns    Wears seat belt    Safe in relationship    Never smoker    Social Determinants of Health   Financial Resource Strain: Low Risk    Difficulty of Paying Living Expenses: Not hard at all  Food Insecurity: No Food Insecurity   Worried About Charity fundraiser in the Last Year: Never true   Ran Out of Food in the Last Year: Never true  Transportation Needs: No Transportation Needs   Lack of Transportation (Medical): No   Lack of Transportation (Non-Medical): No  Physical Activity: Not on file  Stress: No Stress Concern Present   Feeling of Stress : Not at all  Social Connections: Unknown   Frequency of Communication with Friends and Family: More than three times a week   Frequency of Social Gatherings with Friends and Family: More than three times a week   Attends Religious Services: Not on file  Active Member of Clubs or Organizations: Not on file   Attends Archivist Meetings: Not on file   Marital Status: Not on file  Intimate Partner Violence: Not At Risk   Fear of Current or Ex-Partner: No   Emotionally Abused: No   Physically Abused: No   Sexually Abused: No   Current Meds  Medication Sig   acetaminophen (TYLENOL) 650 MG CR tablet Take 1,300 mg by mouth every 8 (eight) hours as needed for pain.   Alpha-Lipoic Acid 600 MG CAPS Take 600 mg by mouth 2 (two) times daily.   amLODipine (NORVASC) 5 MG tablet TAKE 1 TABLET(5 MG) BY MOUTH DAILY   Cholecalciferol (HM VITAMIN D3) 100 MCG (4000 UT) CAPS Take 4,000 Units by mouth daily.   cyanocobalamin (,VITAMIN B-12,) 1000 MCG/ML injection Inject 1 mL (1,000 mcg total) into the muscle every 30 (thirty) days.   metFORMIN (GLUCOPHAGE) 500 MG tablet TAKE 1 TABLET(500 MG) BY MOUTH DAILY WITH BREAKFAST AND FOOD   nebivolol (BYSTOLIC) 2.5 MG tablet Take 1 tablet (2.5 mg total) by mouth daily.   pneumococcal 20-valent  conjugate vaccine (PREVNAR 20) 0.5 ML injection Inject 0.5 mLs into the muscle tomorrow at 10 am for 1 dose.   pravastatin (PRAVACHOL) 20 MG tablet Take 1 tablet (20 mg total) by mouth at bedtime.   terbinafine (LAMISIL AT) 1 % cream Apply 1 application. topically 2 (two) times daily. B/l feet   terbinafine (LAMISIL) 250 MG tablet Take 1 tablet (250 mg total) by mouth daily.   Current Facility-Administered Medications for the 05/15/21 encounter (Office Visit) with McLean-Scocuzza, Nino Glow, MD  Medication   cyanocobalamin ((VITAMIN B-12)) injection 1,000 mcg   Allergies  Allergen Reactions   Losartan     High potassium  AKI   No results found for this or any previous visit (from the past 2160 hour(s)). Objective  Body mass index is 39.65 kg/m. Wt Readings from Last 3 Encounters:  05/15/21 231 lb (104.8 kg)  04/17/21 234 lb (106.1 kg)  11/05/20 234 lb 3.2 oz (106.2 kg)   Temp Readings from Last 3 Encounters:  05/15/21 98.2 F (36.8 C) (Oral)  08/22/20 98.2 F (36.8 C) (Oral)  02/22/20 98.3 F (36.8 C) (Oral)   BP Readings from Last 3 Encounters:  05/15/21 (!) 142/70  08/22/20 (!) 144/72  02/22/20 (!) 150/70   Pulse Readings from Last 3 Encounters:  05/15/21 96  08/22/20 61  02/22/20 92    Physical Exam Vitals and nursing note reviewed.  Constitutional:      Appearance: Normal appearance. She is well-developed and well-groomed.  HENT:     Head: Normocephalic and atraumatic.  Eyes:     Conjunctiva/sclera: Conjunctivae normal.     Pupils: Pupils are equal, round, and reactive to light.  Cardiovascular:     Rate and Rhythm: Normal rate and regular rhythm.     Heart sounds: Normal heart sounds. No murmur heard. Pulmonary:     Effort: Pulmonary effort is normal.     Breath sounds: Normal breath sounds.  Abdominal:     General: Abdomen is flat. Bowel sounds are normal.     Tenderness: There is no abdominal tenderness.  Musculoskeletal:        General: No tenderness.   Skin:    General: Skin is warm and dry.  Neurological:     General: No focal deficit present.     Mental Status: She is alert and oriented to person, place, and time. Mental status  is at baseline.     Cranial Nerves: Cranial nerves 2-12 are intact.     Motor: Motor function is intact.     Coordination: Coordination is intact.     Gait: Gait is intact.  Psychiatric:        Attention and Perception: Attention and perception normal.        Mood and Affect: Mood and affect normal.        Speech: Speech normal.        Behavior: Behavior normal. Behavior is cooperative.        Thought Content: Thought content normal.        Cognition and Memory: Cognition and memory normal.        Judgment: Judgment normal.    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 Michail Sermon -call to see when due  Likely in 2022 max 1 year Sch  week on 10/05/20   hyperplastic polyp 10/05/20,neg celiac/neg h pylori ([facility])   Pap s/p hysterectomy no h/o abnormal pap last 6-7 years ago ovaries intact  Never smoker    rec healthy diet and exercise    Provider: Dr. [name] McLean-Scocuzza-[facility]

## 2021-05-15 NOTE — Patient Instructions (Addendum)
Discuss with Dr. Vertell Limber epidural injection for chronic back pain  Think about covid shot 4 th dose    Pneumococcal Conjugate Vaccine (Prevnar 20) Suspension for Injection What is this medication? PNEUMOCOCCAL VACCINE (NEU mo KOK al vak SEEN) is a vaccine. It prevents pneumococcus bacterial infections. These bacteria can cause serious infections like pneumonia, meningitis, and blood infections. This vaccine will not treat an infection and will not cause infection. This vaccine is recommended for adults 18 years and older. This medicine may be used for other purposes; ask your health care provider or pharmacist if you have questions. COMMON BRAND NAME(S): Prevnar 20 What should I tell my care team before I take this medication? They need to know if you have any of these conditions: bleeding disorder fever immune system problems an unusual or allergic reaction to pneumococcal vaccine, diphtheria toxoid, other vaccines, other medicines, foods, dyes, or preservatives pregnant or trying to get pregnant breast-feeding How should I use this medication? This vaccine is injected into a muscle. It is given by a health care provider. A copy of Vaccine Information Statements will be given before each vaccination. Be sure to read this information carefully each time. This sheet may change often. Talk to your health care provider about the use of this medicine in children. Special care may be needed. Overdosage: If you think you have taken too much of this medicine contact a poison control center or emergency room at once. NOTE: This medicine is only for you. Do not share this medicine with others. What if I miss a dose? This does not apply. This medicine is not for regular use. What may interact with this medication? medicines for cancer chemotherapy medicines that suppress your immune function steroid medicines like prednisone or cortisone This list may not describe all possible interactions. Give  your health care provider a list of all the medicines, herbs, non-prescription drugs, or dietary supplements you use. Also tell them if you smoke, drink alcohol, or use illegal drugs. Some items may interact with your medicine. What should I watch for while using this medication? Mild fever and pain should go away in 3 days or less. Report any unusual symptoms to your health care provider. What side effects may I notice from receiving this medication? Side effects that you should report to your doctor or health care professional as soon as possible: allergic reactions (skin rash, itching or hives; swelling of the face, lips, or tongue) confusion fast, irregular heartbeat fever over 102 degrees F muscle weakness seizures trouble breathing unusual bruising or bleeding Side effects that usually do not require medical attention (report to your doctor or health care professional if they continue or are bothersome): fever of 102 degrees F or less headache joint pain muscle cramps, pain pain, tender at site where injected This list may not describe all possible side effects. Call your doctor for medical advice about side effects. You may report side effects to FDA at 1-800-FDA-1088. Where should I keep my medication? This vaccine is only given by a health care provider. It will not be stored at home. NOTE: This sheet is a summary. It may not cover all possible information. If you have questions about this medicine, talk to your doctor, pharmacist, or health care provider.  2022 Elsevier/Gold Standard (2019-11-10 00:00:00)  Terbinafine Tablets What is this medication? TERBINAFINE (TER bin a feen) treats fungal infections of the nails. It belongs to a group of medications called antifungals. It will not treat infections caused by bacteria  or viruses. This medicine may be used for other purposes; ask your health care provider or pharmacist if you have questions. COMMON BRAND NAME(S): Lamisil,  Terbinex What should I tell my care team before I take this medication? They need to know if you have any of these conditions: Liver disease An unusual or allergic reaction to terbinafine, other medications, foods, dyes, or preservatives Pregnant or trying to get pregnant Breast-feeding How should I use this medication? Take this medication by mouth with water. Take it as directed on the prescription label at the same time every day. You can take it with or without food. If it upsets your stomach, take it with food. Keep taking it unless your care team tells you to stop. A special MedGuide will be given to you by the pharmacist with each prescription and refill. Be sure to read this information carefully each time. Talk to your care team regarding the use of this medication in children. Special care may be needed. Overdosage: If you think you have taken too much of this medicine contact a poison control center or emergency room at once. NOTE: This medicine is only for you. Do not share this medicine with others. What if I miss a dose? If you miss a dose, take it as soon as you can unless it is more than 4 hours late. If it is more than 4 hours late, skip the missed dose. Take the next dose at the normal time. What may interact with this medication? Do not take this medication with any of the following: Pimozide Thioridazine This medication may also interact with the following: Beta blockers Caffeine Certain medications for mental health conditions Cimetidine Cyclosporine Medications for fungal infections like fluconazole and ketoconazole Medications for irregular heartbeat like amiodarone, flecainide and propafenone Rifampin Warfarin This list may not describe all possible interactions. Give your health care provider a list of all the medicines, herbs, non-prescription drugs, or dietary supplements you use. Also tell them if you smoke, drink alcohol, or use illegal drugs. Some items may  interact with your medicine. What should I watch for while using this medication? Visit your care team for regular checks on your progress. You may need blood work while you are taking this medication. It may be some time before you see the benefit from this medication. This medication may cause serious skin reactions. They can happen weeks to months after starting the medication. Contact your care team right away if you notice fevers or flu-like symptoms with a rash. The rash may be red or purple and then turn into blisters or peeling of the skin. Or, you might notice a red rash with swelling of the face, lips or lymph nodes in your neck or under your arms. This medication can make you more sensitive to the sun. Keep out of the sun, If you cannot avoid being in the sun, wear protective clothing and sunscreen. Do not use sun lamps or tanning beds/booths. What side effects may I notice from receiving this medication? Side effects that you should report to your care team as soon as possible: Allergic reactions--skin rash, itching, hives, swelling of the face, lips, tongue, or throat Change in sense of smell Change in taste Infection--fever, chills, cough, or sore throat Liver injury--right upper belly pain, loss of appetite, nausea, light-colored stool, dark yellow or brown urine, yellowing skin or eyes, unusual weakness or fatigue Low red blood cell level--unusual weakness or fatigue, dizziness, headache, trouble breathing Lupus-like syndrome--joint pain, swelling, or stiffness,  butterfly-shaped rash on the face, rashes that get worse in the sun, fever, unusual weakness or fatigue Rash, fever, and swollen lymph nodes Redness, blistering, peeling, or loosening of the skin, including inside the mouth Unusual bruising or bleeding Worsening mood, feelings of depression Side effects that usually do not require medical attention (report to your care team if they continue or are  bothersome): Diarrhea Gas Headache Nausea Stomach pain Upset stomach This list may not describe all possible side effects. Call your doctor for medical advice about side effects. You may report side effects to FDA at 1-800-FDA-1088. Where should I keep my medication? Keep out of the reach of children and pets. Store between 20 and 25 degrees C (68 and 77 degrees F). Protect from light. Get rid of any unused medication after the expiration date. To get rid of medications that are no longer needed or have expired: Take the medication to a medication take-back program. Check with your pharmacy or law enforcement to find a location. If you cannot return the medication, check the label or package insert to see if the medication should be thrown out in the garbage or flushed down the toilet. If you are not sure, ask your care team. If it is safe to put it in the trash, take the medication out of the container. Mix the medication with cat litter, dirt, coffee grounds, or other unwanted substance. Seal the mixture in a bag or container. Put it in the trash. NOTE: This sheet is a summary. It may not cover all possible information. If you have questions about this medicine, talk to your doctor, pharmacist, or health care provider.  2022 Elsevier/Gold Standard (2020-10-10 00:00:00)  Fungal Nail Infection A fungal nail infection is a common infection of the toenails or fingernails. This condition affects toenails more often than fingernails. It often affects the great, or big, toes. More than one nail may be infected. The condition can be passed from person to person (is contagious). What are the causes? This condition is caused by a fungus, such as yeast or molds. Several types of fungi can cause the infection. These fungi are common in moist and warm areas. If your hands or feet come into contact with the fungus, it may get into a crack in your fingernail or toenail or in the surrounding skin, and cause  an infection. What increases the risk? The following factors may make you more likely to develop this condition: Being of older age. Having certain medical conditions, such as: Athlete's foot. Diabetes. Poor circulation. A weak body defense system (immune system). Walking barefoot in areas where the fungus thrives, such as showers or locker rooms. Wearing shoes and socks that cause your feet to sweat. Having a nail injury or a recent nail surgery. What are the signs or symptoms? Symptoms of this condition include: A pale spot on the nail. Thickening of the nail. A nail that becomes yellow, brown, or white. A brittle or ragged nail edge. A nail that has lifted away from the nail bed. How is this diagnosed? This condition is diagnosed with a physical exam. Your health care provider may take a scraping or clipping from your nail to test for the fungus. How is this treated? Treatment is not needed for mild infections. If you have significant nail changes, treatment may include: Antifungal medicines taken by mouth (orally). You may need to take the medicine for several weeks or several months, and you may not see the results for a long time.  These medicines can cause side effects. Ask your health care provider what problems to watch for. Antifungal nail polish or nail cream. These may be used along with oral antifungal medicines. Laser treatment of the nail. Surgery to remove the nail. This may be needed for the most severe infections. It can take a long time, usually up to a year, for the infection to go away. The infection may also come back. Follow these instructions at home: Medicines Take or apply over-the-counter and prescription medicines only as told by your health care provider. Ask your health care provider about using over-the-counter mentholated ointment on your nails. Nail care Trim your nails often. Wash and dry your hands and feet every day. Keep your feet dry. To do  this: Wear absorbent socks, and change your socks frequently. Wear shoes that allow air to circulate, such as sandals or canvas tennis shoes. Throw out old shoes. If you go to a nail salon, make sure you choose one that uses clean instruments. Use antifungal foot powder on your feet and in your shoes. General instructions Do not share personal items, such as towels or nail clippers. Do not walk barefoot in shower rooms or locker rooms. Wear rubber gloves if you are working with your hands in wet areas. Keep all follow-up visits. This is important. Contact a health care provider if: You have redness, pain, or pus near the toenail or fingernail. Your infection is not getting better, or it is getting worse after several months. You have more circulation problems near the toenail or fingernail. You have brown or black discoloration of the nail that spreads to the surrounding skin. Summary A fungal nail infection is a common infection of the toenails or fingernails. Treatment is not needed for mild infections. If you have significant nail changes, treatment may include taking medicine orally and applying medicine to your nails. It can take a long time, usually up to a year, for the infection to go away. The infection may also come back. Take or apply over-the-counter and prescription medicines only as told by your health care provider. This information is not intended to replace advice given to you by your health care provider. Make sure you discuss any questions you have with your health care provider. Document Revised: 05/28/2020 Document Reviewed: 05/28/2020 Elsevier Patient Education  2022 [place] Athlete's foot (tinea pedis) is a fungal infection of the skin on your feet. It often occurs on the skin that is between or underneath the toes. It can also occur on the soles of your feet. The infection can spread from person to person (is contagious). It can also spread when  a person's bare feet come in contact with the fungus on shower floors or on items such as shoes. What are the causes? This condition is caused by a fungus that grows in warm, moist places. You can get athlete's foot by sharing shoes, shower stalls, towels, and wet floors with someone who is infected. Not washing your feet or changing your socks often enough can also lead to athlete's foot. What increases the risk? This condition is more likely to develop in: Men. People who have a weak body defense system (immune system). People who have diabetes. People who use public showers, such as at a gym. People who wear heavy-duty shoes, such as Environmental manager. Seasons with warm, humid weather. What are the signs or symptoms? Symptoms of this condition include: Itchy areas between your toes or on the  soles of your feet. White, flaky, or scaly areas between your toes or on the soles of your feet. Very itchy small blisters between your toes or on the soles of your feet. Small cuts in your skin. These cuts can become infected. Thick or discolored toenails. How is this diagnosed? This condition may be diagnosed with a physical exam and a review of your medical history. Your health care provider may also take a skin or toenail sample to examine under a microscope. How is this treated? This condition is treated with antifungal medicines. These may be applied as powders, ointments, or creams. In severe cases, an oral antifungal medicine may be given. Follow these instructions at home: Medicines Apply or take over-the-counter and prescription medicines only as told by your health care provider. Apply your antifungal medicine as told by your health care provider. Do not stop using the antifungal even if your condition improves. Foot care Do not scratch your feet. Keep your feet dry: Wear cotton or wool socks. Change your socks every day or if they become wet. Wear shoes that allow air to  flow, such as sandals or canvas tennis shoes. Wash and dry your feet, including the area between your toes. Also, wash and dry your feet: Every day or as told by your health care provider. After exercising. General instructions Do not let others use towels, shoes, nail clippers, or other personal items that touch your feet. Protect your feet by wearing sandals in wet areas, such as locker rooms and shared showers. Keep all follow-up visits. This is important. If you have diabetes, keep your blood sugar under control. Contact a health care provider if: You have a fever. You have swelling, soreness, warmth, or redness in your foot. Your feet are not getting better with treatment. Your symptoms get worse. You have new symptoms. You have severe pain. Summary Athlete's foot (tinea pedis) is a fungal infection of the skin on your feet. It often occurs on skin that is between or underneath the toes. This condition is caused by a fungus that grows in warm, moist places. Symptoms include white, flaky, or scaly areas between your toes or on the soles of your feet. This condition is treated with antifungal medicines. Keep your feet clean. Always dry them thoroughly. This information is not intended to replace advice given to you by your health care provider. Make sure you discuss any questions you have with your health care provider. Document Revised: 06/17/2020 Document Reviewed: 06/17/2020 Elsevier Patient Education  [place].

## 2021-05-15 NOTE — Addendum Note (Signed)
Addended by: Thressa Sheller on: 05/15/2021 09:45 AM ? ? Modules accepted: Orders ? ?

## 2021-05-16 LAB — MICROALBUMIN / CREATININE URINE RATIO
Creatinine, Urine: 145 mg/dL (ref 20–275)
Microalb Creat Ratio: 8 mcg/mg creat (ref <30)
Microalb, Ur: 1.2 mg/dL

## 2021-05-16 LAB — URINALYSIS, ROUTINE W REFLEX MICROSCOPIC
Bilirubin Urine: NEGATIVE
Glucose, UA: NEGATIVE
Hgb urine dipstick: NEGATIVE
Leukocytes,Ua: NEGATIVE
Nitrite: NEGATIVE
Protein, ur: NEGATIVE
Specific Gravity, Urine: 1.022 (ref 1.001–1.035)
pH: <=5 (ref 5.0–8.0)

## 2021-05-21 ENCOUNTER — Telehealth: Payer: Self-pay | Admitting: Internal Medicine

## 2021-05-21 MED ORDER — CARVEDILOL 3.125 MG PO TABS: 3.1250 mg | ORAL_TABLET | Freq: Two times a day (BID) | ORAL | 3 refills | Status: DC

## 2021-05-21 NOTE — Telephone Encounter (Signed)
Please advise on medication change

## 2021-05-21 NOTE — Telephone Encounter (Signed)
Sent coreg 3.125 2x per day instead of bystolic  ? ?

## 2021-05-21 NOTE — Telephone Encounter (Signed)
Dr [name] prescribed nebivolol (BYSTOLIC) 2.5 MG tablet and her it will cost her over $140. She would like something else cheaper. ?

## 2021-05-21 NOTE — Addendum Note (Signed)
Addended by: Orland Mustard on: 05/21/2021 12:33 PM ? ? Modules accepted: Orders ? ?

## 2021-05-22 NOTE — Telephone Encounter (Signed)
Patient informed and verbalized understanding

## 2021-05-24 DIAGNOSIS — H25812 Combined forms of age-related cataract, left eye: Secondary | ICD-10-CM | POA: Diagnosis not present

## 2021-06-07 DIAGNOSIS — H25811 Combined forms of age-related cataract, right eye: Secondary | ICD-10-CM | POA: Diagnosis not present

## 2021-06-18 ENCOUNTER — Ambulatory Visit (INDEPENDENT_AMBULATORY_CARE_PROVIDER_SITE_OTHER): Payer: Medicare Other

## 2021-06-18 DIAGNOSIS — E538 Deficiency of other specified B group vitamins: Secondary | ICD-10-CM

## 2021-06-18 MED ORDER — CYANOCOBALAMIN 1000 MCG/ML IJ SOLN
1000.0000 ug | Freq: Once | INTRAMUSCULAR | Status: AC
  Administered 2021-06-18: 1000 ug via INTRAMUSCULAR

## 2021-06-18 NOTE — Progress Notes (Signed)
Patient presented for B 12 injection to left deltoid, patient voiced no concerns nor showed any signs of distress during injection. 

## 2021-06-23 ENCOUNTER — Encounter: Payer: Self-pay | Admitting: Internal Medicine

## 2021-06-23 DIAGNOSIS — H269 Unspecified cataract: Secondary | ICD-10-CM | POA: Insufficient documentation

## 2021-06-23 DIAGNOSIS — H259 Unspecified age-related cataract: Secondary | ICD-10-CM

## 2021-06-25 ENCOUNTER — Ambulatory Visit
Admission: RE | Admit: 2021-06-25 | Discharge: 2021-06-25 | Disposition: A | Discharge status: Home / Self Care | Payer: Medicare Other | Source: Ambulatory Visit | Attending: Internal Medicine | Admitting: Internal Medicine

## 2021-06-25 DIAGNOSIS — Z1231 Encounter for screening mammogram for malignant neoplasm of breast: Secondary | ICD-10-CM | POA: Diagnosis not present

## 2021-06-26 ENCOUNTER — Other Ambulatory Visit: Payer: Self-pay | Admitting: Internal Medicine

## 2021-06-26 DIAGNOSIS — R928 Other abnormal and inconclusive findings on diagnostic imaging of breast: Secondary | ICD-10-CM

## 2021-07-06 ENCOUNTER — Ambulatory Visit: Payer: Medicare Other

## 2021-07-06 ENCOUNTER — Ambulatory Visit
Admission: RE | Admit: 2021-07-06 | Discharge: 2021-07-06 | Disposition: A | Discharge status: Home / Self Care | Payer: Medicare Other | Source: Ambulatory Visit | Attending: Internal Medicine | Admitting: Internal Medicine

## 2021-07-06 DIAGNOSIS — R928 Other abnormal and inconclusive findings on diagnostic imaging of breast: Secondary | ICD-10-CM

## 2021-07-06 DIAGNOSIS — N6489 Other specified disorders of breast: Secondary | ICD-10-CM | POA: Diagnosis not present

## 2021-07-12 ENCOUNTER — Other Ambulatory Visit: Payer: Self-pay | Admitting: Internal Medicine

## 2021-07-12 DIAGNOSIS — E119 Type 2 diabetes mellitus without complications: Secondary | ICD-10-CM

## 2021-07-18 ENCOUNTER — Ambulatory Visit (INDEPENDENT_AMBULATORY_CARE_PROVIDER_SITE_OTHER): Payer: Medicare Other | Admitting: *Deleted

## 2021-07-18 DIAGNOSIS — E538 Deficiency of other specified B group vitamins: Secondary | ICD-10-CM | POA: Diagnosis not present

## 2021-07-18 MED ORDER — CYANOCOBALAMIN 1000 MCG/ML IJ SOLN
1000.0000 ug | Freq: Once | INTRAMUSCULAR | Status: AC
  Administered 2021-07-18: 1000 ug via INTRAMUSCULAR

## 2021-07-18 NOTE — Progress Notes (Signed)
Patient presented for B 12 injection to right deltoid, patient voiced no concerns nor showed any signs of distress during injection. 

## 2021-08-20 ENCOUNTER — Ambulatory Visit (INDEPENDENT_AMBULATORY_CARE_PROVIDER_SITE_OTHER): Payer: Medicare Other

## 2021-08-20 DIAGNOSIS — E538 Deficiency of other specified B group vitamins: Secondary | ICD-10-CM

## 2021-08-20 MED ORDER — CYANOCOBALAMIN 1000 MCG/ML IJ SOLN
1000.0000 ug | Freq: Once | INTRAMUSCULAR | Status: AC
  Administered 2021-08-20: 1000 ug via INTRAMUSCULAR

## 2021-08-20 NOTE — Progress Notes (Signed)
Patient presented for B 12 injection to left deltoid, patient voiced no concerns nor showed any signs of distress during injection. 

## 2021-09-19 ENCOUNTER — Encounter: Payer: Self-pay | Admitting: Internal Medicine

## 2021-09-19 ENCOUNTER — Ambulatory Visit (INDEPENDENT_AMBULATORY_CARE_PROVIDER_SITE_OTHER): Payer: Medicare Other | Admitting: Internal Medicine

## 2021-09-19 VITALS — BP 140/70 | HR 78 | Temp 97.7°F | Resp 14 | Ht 64.0 in | Wt 239.4 lb

## 2021-09-19 DIAGNOSIS — E538 Deficiency of other specified B group vitamins: Secondary | ICD-10-CM

## 2021-09-19 DIAGNOSIS — Z1231 Encounter for screening mammogram for malignant neoplasm of breast: Secondary | ICD-10-CM

## 2021-09-19 DIAGNOSIS — E559 Vitamin D deficiency, unspecified: Secondary | ICD-10-CM | POA: Diagnosis not present

## 2021-09-19 DIAGNOSIS — E119 Type 2 diabetes mellitus without complications: Secondary | ICD-10-CM | POA: Diagnosis not present

## 2021-09-19 DIAGNOSIS — E1159 Type 2 diabetes mellitus with other circulatory complications: Secondary | ICD-10-CM | POA: Diagnosis not present

## 2021-09-19 DIAGNOSIS — I10 Essential (primary) hypertension: Secondary | ICD-10-CM

## 2021-09-19 DIAGNOSIS — Z23 Encounter for immunization: Secondary | ICD-10-CM | POA: Diagnosis not present

## 2021-09-19 DIAGNOSIS — Z6841 Body Mass Index (BMI) 40.0 and over, adult: Secondary | ICD-10-CM

## 2021-09-19 LAB — CBC WITH DIFFERENTIAL/PLATELET
Basophils Absolute: 0 10*3/uL (ref 0.0–0.1)
Basophils Relative: 0.4 % (ref 0.0–3.0)
Eosinophils Absolute: 0 10*3/uL (ref 0.0–0.7)
Eosinophils Relative: 0.4 % (ref 0.0–5.0)
HCT: 38.1 % (ref 36.0–46.0)
Hemoglobin: 12.2 g/dL (ref 12.0–15.0)
Lymphocytes Relative: 17.2 % (ref 12.0–46.0)
Lymphs Abs: 1.5 10*3/uL (ref 0.7–4.0)
MCHC: 31.9 g/dL (ref 30.0–36.0)
MCV: 89.3 fl (ref 78.0–100.0)
Monocytes Absolute: 0.5 10*3/uL (ref 0.1–1.0)
Monocytes Relative: 5.5 % (ref 3.0–12.0)
Neutro Abs: 6.8 10*3/uL (ref 1.4–7.7)
Neutrophils Relative %: 76.5 % (ref 43.0–77.0)
Platelets: 293 10*3/uL (ref 150.0–400.0)
RBC: 4.27 Mil/uL (ref 3.87–5.11)
RDW: 15.3 % (ref 11.5–15.5)
WBC: 8.9 10*3/uL (ref 4.0–10.5)

## 2021-09-19 LAB — LIPID PANEL
Cholesterol: 151 mg/dL (ref 0–200)
HDL: 53.5 mg/dL (ref >39.00)
LDL Cholesterol: 82 mg/dL (ref 0–99)
NonHDL: 97.69
Total CHOL/HDL Ratio: 3
Triglycerides: 79 mg/dL (ref 0.0–149.0)
VLDL: 15.8 mg/dL (ref 0.0–40.0)

## 2021-09-19 LAB — VITAMIN D 25 HYDROXY (VIT D DEFICIENCY, FRACTURES): VITD: 49.73 ng/mL (ref 30.00–100.00)

## 2021-09-19 LAB — COMPREHENSIVE METABOLIC PANEL
ALT: 10 U/L (ref 0–35)
AST: 12 U/L (ref 0–37)
Albumin: 4.3 g/dL (ref 3.5–5.2)
Alkaline Phosphatase: 82 U/L (ref 39–117)
BUN: 17 mg/dL (ref 6–23)
CO2: 23 mEq/L (ref 19–32)
Calcium: 9.4 mg/dL (ref 8.4–10.5)
Chloride: 106 mEq/L (ref 96–112)
Creatinine, Ser: 0.85 mg/dL (ref 0.40–1.20)
GFR: 69.42 mL/min (ref >60.00)
Glucose, Bld: 134 mg/dL — ABNORMAL HIGH (ref 70–99)
Potassium: 3.9 mEq/L (ref 3.5–5.1)
Sodium: 139 mEq/L (ref 135–145)
Total Bilirubin: 0.6 mg/dL (ref 0.2–1.2)
Total Protein: 7.8 g/dL (ref 6.0–8.3)

## 2021-09-19 LAB — HEMOGLOBIN A1C: Hgb A1c MFr Bld: 6.7 % — ABNORMAL HIGH (ref 4.6–6.5)

## 2021-09-19 MED ORDER — AMLODIPINE BESYLATE 5 MG PO TABS: ORAL_TABLET | ORAL | 3 refills | Status: AC

## 2021-09-19 MED ORDER — PRAVASTATIN SODIUM 20 MG PO TABS: 20.0000 mg | ORAL_TABLET | Freq: Every day | ORAL | 3 refills | Status: DC

## 2021-09-19 MED ORDER — CARVEDILOL 6.25 MG PO TABS: 6.2500 mg | ORAL_TABLET | Freq: Two times a day (BID) | ORAL | 3 refills | Status: AC

## 2021-09-19 MED ORDER — CYANOCOBALAMIN 1000 MCG/ML IJ SOLN
1000.0000 ug | Freq: Once | INTRAMUSCULAR | Status: AC
  Administered 2021-09-19: 1000 ug via INTRAMUSCULAR

## 2021-09-19 MED ORDER — METFORMIN HCL 500 MG PO TABS: 500.0000 mg | ORAL_TABLET | Freq: Every day | ORAL | 3 refills | Status: DC

## 2021-09-19 NOTE — Progress Notes (Signed)
Chief Complaint  Patient presents with   Follow-up    4 mon, denies any concerns or pain   F/u 1. Htn sl elevated today on norvasc 5 and coreg 3.125 mg bid  2. Dm 2 controlled on metformin 500 mg qd and on statin  No issues  3. B12 def given B12 and prevnar 20 today    Review of Systems  Constitutional:  Negative for weight loss.  HENT:  Negative for hearing loss.   Eyes:  Negative for blurred vision.  Respiratory:  Negative for shortness of breath.   Cardiovascular:  Negative for chest pain.  Gastrointestinal:  Negative for abdominal pain and blood in stool.  Genitourinary:  Negative for dysuria.  Musculoskeletal:  Negative for falls and joint pain.  Skin:  Negative for rash.  Neurological:  Negative for headaches.  Psychiatric/Behavioral:  Negative for depression.    Past Medical History:  Diagnosis Date   Cataract    OS>OD 03/14/21 [place] [facility]   Chickenpox    DDD (degenerative disc disease), lumbar    Diabetes mellitus without complication (HCC)    GERD (gastroesophageal reflux disease)    Hypertension    Lumbar herniated disc    Spinal stenosis    Past Surgical History:  Procedure Laterality Date   ABDOMINAL HYSTERECTOMY  1983   fibroids    CARPAL TUNNEL RELEASE     rt   Family History  Problem Relation Age of Onset   Cancer Father        pancreatic cancer    Cancer Sister        breast cancer age 78s   Diabetes Sister    Cancer Brother        pancreatic cancer   Stroke Brother        died stroke   ALS Mother        83s   Social History   Socioeconomic History   Marital status: Single    Spouse name: Not on file   Number of children: Not on file   Years of education: Not on file   Highest education level: Not on file  Occupational History   Not on file  Tobacco Use   Smoking status: Never   Smokeless tobacco: Never  Vaping Use   Vaping Use: Never used  Substance and Sexual Activity   Alcohol use: Yes    Comment: social   Drug use: Never    Sexual activity: Not Currently  Other Topics Concern   Not on file  Social History Narrative   [employer] worker part time    [employer] th grade ed    2 kids son and daughter [name] also my patient    No guns    Wears seat belt    Safe in relationship    Never smoker    Social Determinants of Health   Financial Resource Strain: Low Risk  (04/17/2021)   Overall Financial Resource Strain (CARDIA)    Difficulty of Paying Living Expenses: Not hard at all  Food Insecurity: No Food Insecurity (04/17/2021)   Hunger Vital Sign    Worried About Running Out of Food in the Last Year: Never true    Ran Out of Food in the Last Year: Never true  Transportation Needs: No Transportation Needs (04/17/2021)   PRAPARE - Hydrologist (Medical): No    Lack of Transportation (Non-Medical): No  Physical Activity: Not on file  Stress: No Stress Concern Present (04/17/2021)  [place]    Feeling of Stress : Not at all  Social Connections: Unknown (04/17/2021)   Social Connection and Isolation Panel [NHANES]    Frequency of Communication with Friends and Family: More than three times a week    Frequency of Social Gatherings with Friends and Family: More than three times a week    Attends Religious Services: Not on file    Active Member of Clubs or Organizations: Not on file    Attends Archivist Meetings: Not on file    Marital Status: Not on file  Intimate Partner Violence: Not At Risk (04/17/2021)   Humiliation, Afraid, Rape, and Kick questionnaire    Fear of Current or Ex-Partner: No    Emotionally Abused: No    Physically Abused: No    Sexually Abused: No   Current Meds  Medication Sig   acetaminophen (TYLENOL) 650 MG CR tablet Take 1,300 mg by mouth every 8 (eight) hours as needed for pain.   Alpha-Lipoic Acid 600 MG CAPS Take 600 mg by mouth 2 (two) times daily.   Cholecalciferol (HM VITAMIN D3) 100 MCG  (4000 UT) CAPS Take 4,000 Units by mouth daily.   terbinafine (LAMISIL AT) 1 % cream Apply 1 application. topically 2 (two) times daily. B/l feet   [DISCONTINUED] amLODipine (NORVASC) 5 MG tablet TAKE 1 TABLET(5 MG) BY MOUTH DAILY   [DISCONTINUED] metFORMIN (GLUCOPHAGE) 500 MG tablet TAKE 1 TABLET(500 MG) BY MOUTH DAILY WITH BREAKFAST AND FOOD   [DISCONTINUED] pravastatin (PRAVACHOL) 20 MG tablet Take 1 tablet (20 mg total) by mouth at bedtime.   Current Facility-Administered Medications for the 09/19/21 encounter (Office Visit) with McLean-Scocuzza, Nino Glow, MD  Medication   cyanocobalamin ((VITAMIN B-12)) injection 1,000 mcg   Allergies  Allergen Reactions   Losartan     High potassium  AKI   No results found for this or any previous visit (from the past 2160 hour(s)). Objective  Body mass index is 41.09 kg/m. Wt Readings from Last 3 Encounters:  09/19/21 239 lb 6.4 oz (108.6 kg)  05/15/21 231 lb (104.8 kg)  04/17/21 234 lb (106.1 kg)   Temp Readings from Last 3 Encounters:  09/19/21 97.7 F (36.5 C) (Oral)  05/15/21 98.2 F (36.8 C) (Oral)  08/22/20 98.2 F (36.8 C) (Oral)   BP Readings from Last 3 Encounters:  09/19/21 140/70  05/15/21 (!) 142/70  08/22/20 (!) 144/72   Pulse Readings from Last 3 Encounters:  09/19/21 78  05/15/21 96  08/22/20 61    Physical Exam Vitals and nursing note reviewed.  Constitutional:      Appearance: Normal appearance. She is well-developed and well-groomed.  HENT:     Head: Normocephalic and atraumatic.  Eyes:     Conjunctiva/sclera: Conjunctivae normal.     Pupils: Pupils are equal, round, and reactive to light.  Cardiovascular:     Rate and Rhythm: Normal rate and regular rhythm.     Heart sounds: Normal heart sounds. No murmur heard. Pulmonary:     Effort: Pulmonary effort is normal.     Breath sounds: Normal breath sounds.  Abdominal:     General: Abdomen is flat. Bowel sounds are normal.     Tenderness: There is no  abdominal tenderness.  Musculoskeletal:        General: No tenderness.  Skin:    General: Skin is warm and dry.  Neurological:     General: No focal deficit present.  Mental Status: She is alert and oriented to person, place, and time. Mental status is at baseline.     Cranial Nerves: Cranial nerves 2-12 are intact.     Motor: Motor function is intact.     Coordination: Coordination is intact.     Gait: Gait is intact.  Psychiatric:        Attention and Perception: Attention and perception normal.        Mood and Affect: Mood and affect normal.        Speech: Speech normal.        Behavior: Behavior normal. Behavior is cooperative.        Thought Content: Thought content normal.        Cognition and Memory: Cognition and memory normal.        Judgment: Judgment normal.     Assessment  Plan  Hypertension associated with diabetes ([place]) - Plan: carvedilol (COREG) 6.25 MG tablet, amLODipine (NORVASC) 5 MG tablet  Type 2 diabetes mellitus without complication, without long-term current use of insulin (HCC) - Plan: pravastatin (PRAVACHOL) 20 MG tablet, metFORMIN (GLUCOPHAGE) 500 MG tablet   B12 deficiency - Plan: cyanocobalamin ((VITAMIN B-12)) injection 1,000 mcg  Need for pneumococcal 20-valent conjugate vaccination - Plan: Pneumococcal conjugate vaccine 20-valent  Morbid obesity ([place]) Class 3 severe obesity due to excess calories with serious comorbidity and body mass index (BMI) of 40.0 to 44.9 in adult ([place])   HM Flu shot utd declinesTdap Prevnar 20 today no further pna vaccines due covid 3/3  consider moderna think about 4th booster  Disc shingles consider in the future Consider twinrix vaccine +fatty liver   Mammogram 04/22/19 neg ordered call to sch  07/06/21 negative ordered 06/2022  DEXA 02/03/18 negative    colonoscopy [facility] referred due 2022 h/o polyps multiple -ROI sent [facility] 07/14/19 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 Michail Sermon -call to  see when due  Likely in 2022 max 1 year Sch week on 10/05/20   hyperplastic polyp 10/05/20,neg celiac/neg h pylori ([facility]) f/u in 5 years    Pap s/p hysterectomy no h/o abnormal pap last 6-7 years ago ovaries intact   Never smoker    rec healthy diet and exercise    Provider: Dr. [name] McLean-Scocuzza-[facility]

## 2021-09-19 NOTE — Patient Instructions (Signed)
Pneumococcal Conjugate Vaccine (Prevnar 20) Suspension for Injection What is this medication? PNEUMOCOCCAL VACCINE (NEU mo KOK al vak SEEN) is a vaccine. It prevents pneumococcus bacterial infections. These bacteria can cause serious infections like pneumonia, meningitis, and blood infections. This vaccine will not treat an infection and will not cause infection. This vaccine is recommended for adults 18 years and older. This medicine may be used for other purposes; ask your health care provider or pharmacist if you have questions. COMMON BRAND NAME(S): Prevnar 20 What should I tell my care team before I take this medication? They need to know if you have any of these conditions: bleeding disorder fever immune system problems an unusual or allergic reaction to pneumococcal vaccine, diphtheria toxoid, other vaccines, other medicines, foods, dyes, or preservatives pregnant or trying to get pregnant breast-feeding How should I use this medication? This vaccine is injected into a muscle. It is given by a health care provider. A copy of Vaccine Information Statements will be given before each vaccination. Be sure to read this information carefully each time. This sheet may change often. Talk to your health care provider about the use of this medicine in children. Special care may be needed. Overdosage: If you think you have taken too much of this medicine contact a poison control center or emergency room at once. NOTE: This medicine is only for you. Do not share this medicine with others. What if I miss a dose? This does not apply. This medicine is not for regular use. What may interact with this medication? medicines for cancer chemotherapy medicines that suppress your immune function steroid medicines like prednisone or cortisone This list may not describe all possible interactions. Give your health care provider a list of all the medicines, herbs, non-prescription drugs, or dietary supplements  you use. Also tell them if you smoke, drink alcohol, or use illegal drugs. Some items may interact with your medicine. What should I watch for while using this medication? Mild fever and pain should go away in 3 days or less. Report any unusual symptoms to your health care provider. What side effects may I notice from receiving this medication? Side effects that you should report to your doctor or health care professional as soon as possible: allergic reactions (skin rash, itching or hives; swelling of the face, lips, or tongue) confusion fast, irregular heartbeat fever over 102 degrees F muscle weakness seizures trouble breathing unusual bruising or bleeding Side effects that usually do not require medical attention (report to your doctor or health care professional if they continue or are bothersome): fever of 102 degrees F or less headache joint pain muscle cramps, pain pain, tender at site where injected This list may not describe all possible side effects. Call your doctor for medical advice about side effects. You may report side effects to FDA at 1-800-FDA-1088. Where should I keep my medication? This vaccine is only given by a health care provider. It will not be stored at home. NOTE: This sheet is a summary. It may not cover all possible information. If you have questions about this medicine, talk to your doctor, pharmacist, or health care provider.  2023 Elsevier/Gold Standard (2019-11-10 00:00:00)

## 2021-09-20 ENCOUNTER — Ambulatory Visit: Payer: Medicare Other

## 2021-10-08 ENCOUNTER — Encounter: Payer: Self-pay | Admitting: Internal Medicine

## 2021-10-22 ENCOUNTER — Encounter: Payer: Medicare Other | Admitting: Family Medicine

## 2021-11-19 ENCOUNTER — Ambulatory Visit (INDEPENDENT_AMBULATORY_CARE_PROVIDER_SITE_OTHER): Payer: Medicare Other | Admitting: Internal Medicine

## 2021-11-19 DIAGNOSIS — E538 Deficiency of other specified B group vitamins: Secondary | ICD-10-CM | POA: Diagnosis not present

## 2021-11-19 MED ORDER — CYANOCOBALAMIN 1000 MCG/ML IJ SOLN
1000.0000 ug | Freq: Once | INTRAMUSCULAR | Status: AC
  Administered 2021-11-19: 1000 ug via INTRAMUSCULAR

## 2021-11-19 NOTE — Progress Notes (Signed)
Vitamin B12 1063m/mL injection was given to pt in the left deltoid. Pt was identified thru 2 identifiers/ Pt tolerated shot well.   [place], CMA

## 2021-11-19 NOTE — Progress Notes (Signed)
B12 shot given today had nurse visit only and she tolerated B12 shot  Dr. [name] MCLean-Scocuzza

## 2021-11-20 ENCOUNTER — Encounter: Payer: Self-pay | Admitting: Internal Medicine

## 2021-12-19 ENCOUNTER — Ambulatory Visit: Payer: Medicare Other

## 2021-12-22 ENCOUNTER — Encounter: Payer: Self-pay | Admitting: Internal Medicine

## 2021-12-22 DIAGNOSIS — E1169 Type 2 diabetes mellitus with other specified complication: Secondary | ICD-10-CM | POA: Insufficient documentation

## 2021-12-22 NOTE — Patient Instructions (Addendum)
     Blood work was ordered.     Medications changes include :      Your prescription(s) have been sent to your pharmacy.    A referral was ordered for XX.     Someone from that office will call you to schedule an appointment.    Return in about 6 months (around 06/24/2022) for Physical Exam.

## 2021-12-22 NOTE — Progress Notes (Unsigned)
Subjective:    Patient ID: Paige Phillips, female    DOB: [birth year], 71 y.o.   MRN: [id number]     HPI She is here to establish with a new pcp.  Paige Phillips is here for follow up of her chronic medical problems, including DM, htn, hld, B12 def, vit d Def  Getting B12 monthly.  Has been getting B12 injections for a while.  No exercise.  She is limited a little bit by chronic back pain.  She knows her diet could be better.  She has seen a nutritionist in the past.  She is concerned about her weight.  Medications and allergies reviewed with patient and updated if appropriate.  Current Outpatient Medications on File Prior to Visit  Medication Sig Dispense Refill   acetaminophen (TYLENOL) 650 MG CR tablet Take 1,300 mg by mouth every 8 (eight) hours as needed for pain.     Alpha-Lipoic Acid 600 MG CAPS Take 600 mg by mouth 2 (two) times daily.     amLODipine (NORVASC) 5 MG tablet TAKE 1 TABLET(5 MG) BY MOUTH DAILY 90 tablet 3   carvedilol (COREG) 6.25 MG tablet Take 1 tablet (6.25 mg total) by mouth 2 (two) times daily with a meal. D/c bystolic due to cost, d/c 3.125 180 tablet 3   Cholecalciferol (HM VITAMIN D3) 100 MCG (4000 UT) CAPS Take 4,000 Units by mouth daily.     metFORMIN (GLUCOPHAGE) 500 MG tablet Take 1 tablet (500 mg total) by mouth daily with breakfast. 90 tablet 3   pravastatin (PRAVACHOL) 20 MG tablet Take 1 tablet (20 mg total) by mouth at bedtime. 90 tablet 3   terbinafine (LAMISIL AT) 1 % cream Apply 1 application. topically 2 (two) times daily. B/l feet 42 g 11   No current facility-administered medications on file prior to visit.     Review of Systems  Constitutional:  Negative for chills and fever.  Respiratory:  Negative for cough, shortness of breath and wheezing.   Cardiovascular:  Negative for chest pain, palpitations and leg swelling.  Gastrointestinal:  Negative for abdominal pain, constipation, diarrhea and nausea.       No gerd  Musculoskeletal:   Positive for back pain.  Neurological:  Positive for numbness (feet from back). Negative for dizziness, light-headedness and headaches.       Objective:   Vitals:   12/23/21 0854 12/23/21 0942  BP: (!) 146/72 134/78  Pulse: 74   Temp: 98 F (36.7 C)   SpO2: 97%    BP Readings from Last 3 Encounters:  12/23/21 134/78  09/19/21 140/70  05/15/21 (!) 142/70   Wt Readings from Last 3 Encounters:  12/23/21 246 lb (111.6 kg)  09/19/21 239 lb 6.4 oz (108.6 kg)  05/15/21 231 lb (104.8 kg)   Body mass index is 42.23 kg/m.    Physical Exam Constitutional:      General: She is not in acute distress.    Appearance: Normal appearance.  HENT:     Head: Normocephalic and atraumatic.  Eyes:     Conjunctiva/sclera: Conjunctivae normal.  Cardiovascular:     Rate and Rhythm: Normal rate and regular rhythm.     Heart sounds: Normal heart sounds. No murmur heard. Pulmonary:     Effort: Pulmonary effort is normal. No respiratory distress.     Breath sounds: Normal breath sounds. No wheezing.  Musculoskeletal:     Cervical back: Neck supple.     Right lower leg: No edema.  Left lower leg: No edema.  Lymphadenopathy:     Cervical: No cervical adenopathy.  Skin:    General: Skin is warm and dry.     Findings: No rash.  Neurological:     Mental Status: She is alert. Mental status is at baseline.  Psychiatric:        Mood and Affect: Mood normal.        Behavior: Behavior normal.        Lab Results  Component Value Date   WBC 8.9 09/19/2021   HGB 12.2 09/19/2021   HCT 38.1 09/19/2021   PLT 293.0 09/19/2021   GLUCOSE 134 (H) 09/19/2021   CHOL 151 09/19/2021   TRIG 79.0 09/19/2021   HDL 53.50 09/19/2021   LDLCALC 82 09/19/2021   ALT 10 09/19/2021   AST 12 09/19/2021   NA 139 09/19/2021   K 3.9 09/19/2021   CL 106 09/19/2021   CREATININE 0.85 09/19/2021   BUN 17 09/19/2021   CO2 23 09/19/2021   TSH 0.84 05/15/2021   HGBA1C 6.7 (H) 09/19/2021   MICROALBUR 1.2  05/15/2021     Assessment & Plan:    See Problem List for Assessment and Plan of chronic medical problems.   We will hold off on blood work and check at her next visit

## 2021-12-23 ENCOUNTER — Ambulatory Visit (INDEPENDENT_AMBULATORY_CARE_PROVIDER_SITE_OTHER): Payer: Medicare Other | Admitting: Internal Medicine

## 2021-12-23 VITALS — BP 134/78 | HR 74 | Temp 98.0°F | Ht 64.0 in | Wt 246.0 lb

## 2021-12-23 DIAGNOSIS — E119 Type 2 diabetes mellitus without complications: Secondary | ICD-10-CM | POA: Diagnosis not present

## 2021-12-23 DIAGNOSIS — E1169 Type 2 diabetes mellitus with other specified complication: Secondary | ICD-10-CM

## 2021-12-23 DIAGNOSIS — E1159 Type 2 diabetes mellitus with other circulatory complications: Secondary | ICD-10-CM | POA: Diagnosis not present

## 2021-12-23 DIAGNOSIS — E538 Deficiency of other specified B group vitamins: Secondary | ICD-10-CM | POA: Diagnosis not present

## 2021-12-23 DIAGNOSIS — E785 Hyperlipidemia, unspecified: Secondary | ICD-10-CM

## 2021-12-23 DIAGNOSIS — I152 Hypertension secondary to endocrine disorders: Secondary | ICD-10-CM | POA: Diagnosis not present

## 2021-12-23 DIAGNOSIS — G629 Polyneuropathy, unspecified: Secondary | ICD-10-CM | POA: Insufficient documentation

## 2021-12-23 MED ORDER — CYANOCOBALAMIN 1000 MCG/ML IJ SOLN
1000.0000 ug | Freq: Once | INTRAMUSCULAR | Status: AC
  Administered 2021-12-23: 1000 ug via INTRAMUSCULAR

## 2021-12-23 MED ORDER — RYBELSUS 3 MG PO TABS: 3.0000 mg | ORAL_TABLET | Freq: Every day | ORAL | 5 refills | Status: DC

## 2021-12-23 NOTE — Assessment & Plan Note (Addendum)
Chronic Regular exercise and healthy diet encouraged Continue pravastatin 20 mg daily

## 2021-12-23 NOTE — Assessment & Plan Note (Signed)
Chronic Getting B12 injections monthly B12 injection today Continue monthly B12 injections

## 2021-12-23 NOTE — Assessment & Plan Note (Addendum)
Chronic Blood pressure well controlled Continue amlodipine 5 mg daily, carvedilol 6.25 mg twice daily

## 2021-12-23 NOTE — Assessment & Plan Note (Addendum)
Chronic  Lab Results  Component Value Date   HGBA1C 6.7 (H) 09/19/2021   Sugars well controlled Continue metformin 500 mg daily with breakfast Will add Rybelsus 3 mg daily to see if that helps further with her sugars and to help her reduce her weight.  Reviewed possible side effects.  Discussed increasing dose as tolerated May be able to discontinue metformin at some point Stressed regular exercise, diabetic diet Deferred seeing a another nutritionist at this time Follow-up in 3 months

## 2022-01-08 DIAGNOSIS — Z961 Presence of intraocular lens: Secondary | ICD-10-CM | POA: Diagnosis not present

## 2022-01-08 DIAGNOSIS — H40033 Anatomical narrow angle, bilateral: Secondary | ICD-10-CM | POA: Diagnosis not present

## 2022-01-23 ENCOUNTER — Ambulatory Visit (INDEPENDENT_AMBULATORY_CARE_PROVIDER_SITE_OTHER): Payer: Medicare Other

## 2022-01-23 DIAGNOSIS — E538 Deficiency of other specified B group vitamins: Secondary | ICD-10-CM

## 2022-01-23 MED ORDER — CYANOCOBALAMIN 1000 MCG/ML IJ SOLN
1000.0000 ug | Freq: Once | INTRAMUSCULAR | Status: AC
  Administered 2022-01-23: 1000 ug via INTRAMUSCULAR

## 2022-01-23 NOTE — Progress Notes (Signed)
B12 given.  Pt tolerated well. Pt is aware to give the office a call for an side effects or reactions. Please co-sign.   

## 2022-02-25 ENCOUNTER — Ambulatory Visit (INDEPENDENT_AMBULATORY_CARE_PROVIDER_SITE_OTHER): Payer: Medicare Other

## 2022-02-25 DIAGNOSIS — E538 Deficiency of other specified B group vitamins: Secondary | ICD-10-CM | POA: Diagnosis not present

## 2022-02-25 MED ORDER — CYANOCOBALAMIN 1000 MCG/ML IJ SOLN
1000.0000 ug | Freq: Once | INTRAMUSCULAR | Status: AC
  Administered 2022-02-25: 1000 ug via INTRAMUSCULAR

## 2022-02-25 NOTE — Progress Notes (Signed)
Pt was given B12 injection w/o any complications. 

## 2022-03-25 ENCOUNTER — Ambulatory Visit: Payer: Medicare Other | Admitting: Internal Medicine

## 2022-03-30 NOTE — Progress Notes (Signed)
Subjective:    Patient ID: Paige Phillips, female    DOB: 04/17/1950, 72 y.o.   MRN: [id number]     HPI Paige Phillips is here for follow up of her chronic medical problems, including DM, htn, hld, B12 def, vit d def  She is eating less.  She is eating less junk food and soda.  Has lost weight  Not exercising.    Medications and allergies reviewed with patient and updated if appropriate.  Current Outpatient Medications on File Prior to Visit  Medication Sig Dispense Refill   acetaminophen (TYLENOL) 650 MG CR tablet Take 1,300 mg by mouth every 8 (eight) hours as needed for pain.     Alpha-Lipoic Acid 600 MG CAPS Take 600 mg by mouth 2 (two) times daily.     amLODipine (NORVASC) 5 MG tablet TAKE 1 TABLET(5 MG) BY MOUTH DAILY 90 tablet 3   carvedilol (COREG) 6.25 MG tablet Take 1 tablet (6.25 mg total) by mouth 2 (two) times daily with a meal. D/c bystolic due to cost, d/c 3.125 180 tablet 3   Cholecalciferol (HM VITAMIN D3) 100 MCG (4000 UT) CAPS Take 4,000 Units by mouth daily.     metFORMIN (GLUCOPHAGE) 500 MG tablet Take 1 tablet (500 mg total) by mouth daily with breakfast. 90 tablet 3   pravastatin (PRAVACHOL) 20 MG tablet Take 1 tablet (20 mg total) by mouth at bedtime. 90 tablet 3   Semaglutide (RYBELSUS) 3 MG TABS Take 3 mg by mouth daily. 30 tablet 5   terbinafine (LAMISIL AT) 1 % cream Apply 1 application. topically 2 (two) times daily. B/l feet 42 g 11   No current facility-administered medications on file prior to visit.     Review of Systems  Constitutional:  Positive for fatigue. Negative for fever.  Respiratory:  Negative for cough, shortness of breath and wheezing.   Cardiovascular:  Negative for chest pain, palpitations and leg swelling.  Neurological:  Positive for headaches (occ). Negative for light-headedness.  Psychiatric/Behavioral:  Negative for sleep disturbance.        Objective:   Vitals:   03/31/22 0927  BP: 138/70  Pulse: 81  Temp: 98.2  F (36.8 C)  SpO2: 94%   BP Readings from Last 3 Encounters:  03/31/22 138/70  12/23/21 134/78  09/19/21 140/70   Wt Readings from Last 3 Encounters:  03/31/22 228 lb (103.4 kg)  12/23/21 246 lb (111.6 kg)  09/19/21 239 lb 6.4 oz (108.6 kg)   Body mass index is 39.14 kg/m.    Physical Exam Constitutional:      General: She is not in acute distress.    Appearance: Normal appearance.  HENT:     Head: Normocephalic and atraumatic.  Eyes:     Conjunctiva/sclera: Conjunctivae normal.  Cardiovascular:     Rate and Rhythm: Normal rate and regular rhythm.     Heart sounds: Normal heart sounds. No murmur heard. Pulmonary:     Effort: Pulmonary effort is normal. No respiratory distress.     Breath sounds: Normal breath sounds. No wheezing.  Musculoskeletal:     Cervical back: Neck supple.     Right lower leg: No edema.     Left lower leg: No edema.  Lymphadenopathy:     Cervical: No cervical adenopathy.  Skin:    General: Skin is warm and dry.     Findings: No rash.  Neurological:     Mental Status: She is alert. Mental status is at  baseline.  Psychiatric:        Mood and Affect: Mood normal.        Behavior: Behavior normal.        Lab Results  Component Value Date   WBC 8.9 09/19/2021   HGB 12.2 09/19/2021   HCT 38.1 09/19/2021   PLT 293.0 09/19/2021   GLUCOSE 134 (H) 09/19/2021   CHOL 151 09/19/2021   TRIG 79.0 09/19/2021   HDL 53.50 09/19/2021   LDLCALC 82 09/19/2021   ALT 10 09/19/2021   AST 12 09/19/2021   NA 139 09/19/2021   K 3.9 09/19/2021   CL 106 09/19/2021   CREATININE 0.85 09/19/2021   BUN 17 09/19/2021   CO2 23 09/19/2021   TSH 0.84 05/15/2021   HGBA1C 6.7 (H) 09/19/2021   MICROALBUR 1.2 05/15/2021     Assessment & Plan:    See Problem List for Assessment and Plan of chronic medical problems.

## 2022-03-30 NOTE — Patient Instructions (Addendum)
     You had your B12 injection today.     Blood work was ordered.   The lab is on the first floor.    Medications changes include :   None     Return in about 6 months (around 09/29/2022) for Physical Exam.

## 2022-03-31 ENCOUNTER — Encounter: Payer: Self-pay | Admitting: Internal Medicine

## 2022-03-31 ENCOUNTER — Ambulatory Visit (INDEPENDENT_AMBULATORY_CARE_PROVIDER_SITE_OTHER): Payer: Medicare Other | Admitting: Internal Medicine

## 2022-03-31 VITALS — BP 138/70 | HR 81 | Temp 98.2°F | Ht 64.0 in | Wt 228.0 lb

## 2022-03-31 DIAGNOSIS — E538 Deficiency of other specified B group vitamins: Secondary | ICD-10-CM

## 2022-03-31 DIAGNOSIS — E1159 Type 2 diabetes mellitus with other circulatory complications: Secondary | ICD-10-CM | POA: Diagnosis not present

## 2022-03-31 DIAGNOSIS — E1169 Type 2 diabetes mellitus with other specified complication: Secondary | ICD-10-CM | POA: Diagnosis not present

## 2022-03-31 DIAGNOSIS — I152 Hypertension secondary to endocrine disorders: Secondary | ICD-10-CM

## 2022-03-31 DIAGNOSIS — E785 Hyperlipidemia, unspecified: Secondary | ICD-10-CM

## 2022-03-31 DIAGNOSIS — E119 Type 2 diabetes mellitus without complications: Secondary | ICD-10-CM

## 2022-03-31 DIAGNOSIS — E559 Vitamin D deficiency, unspecified: Secondary | ICD-10-CM

## 2022-03-31 LAB — LIPID PANEL
Cholesterol: 147 mg/dL (ref 0–200)
HDL: 45.5 mg/dL (ref >39.00)
LDL Cholesterol: 84 mg/dL (ref 0–99)
NonHDL: 101.67
Total CHOL/HDL Ratio: 3
Triglycerides: 89 mg/dL (ref 0.0–149.0)
VLDL: 17.8 mg/dL (ref 0.0–40.0)

## 2022-03-31 LAB — COMPREHENSIVE METABOLIC PANEL
ALT: 18 U/L (ref 0–35)
AST: 16 U/L (ref 0–37)
Albumin: 4.7 g/dL (ref 3.5–5.2)
Alkaline Phosphatase: 95 U/L (ref 39–117)
BUN: 15 mg/dL (ref 6–23)
CO2: 26 mEq/L (ref 19–32)
Calcium: 9.9 mg/dL (ref 8.4–10.5)
Chloride: 103 mEq/L (ref 96–112)
Creatinine, Ser: 0.87 mg/dL (ref 0.40–1.20)
GFR: 67.26 mL/min (ref >60.00)
Glucose, Bld: 120 mg/dL — ABNORMAL HIGH (ref 70–99)
Potassium: 3.9 mEq/L (ref 3.5–5.1)
Sodium: 139 mEq/L (ref 135–145)
Total Bilirubin: 0.6 mg/dL (ref 0.2–1.2)
Total Protein: 9 g/dL — ABNORMAL HIGH (ref 6.0–8.3)

## 2022-03-31 LAB — CBC WITH DIFFERENTIAL/PLATELET
Basophils Absolute: 0 10*3/uL (ref 0.0–0.1)
Basophils Relative: 0.3 % (ref 0.0–3.0)
Eosinophils Absolute: 0.1 10*3/uL (ref 0.0–0.7)
Eosinophils Relative: 1.1 % (ref 0.0–5.0)
HCT: 42.1 % (ref 36.0–46.0)
Hemoglobin: 13.8 g/dL (ref 12.0–15.0)
Lymphocytes Relative: 18 % (ref 12.0–46.0)
Lymphs Abs: 1.9 10*3/uL (ref 0.7–4.0)
MCHC: 32.9 g/dL (ref 30.0–36.0)
MCV: 88.1 fl (ref 78.0–100.0)
Monocytes Absolute: 0.6 10*3/uL (ref 0.1–1.0)
Monocytes Relative: 5.6 % (ref 3.0–12.0)
Neutro Abs: 7.8 10*3/uL — ABNORMAL HIGH (ref 1.4–7.7)
Neutrophils Relative %: 75 % (ref 43.0–77.0)
Platelets: 327 10*3/uL (ref 150.0–400.0)
RBC: 4.78 Mil/uL (ref 3.87–5.11)
RDW: 15.4 % (ref 11.5–15.5)
WBC: 10.4 10*3/uL (ref 4.0–10.5)

## 2022-03-31 LAB — MICROALBUMIN / CREATININE URINE RATIO
Creatinine,U: 366.8 mg/dL
Microalb Creat Ratio: 1 mg/g (ref 0.0–30.0)
Microalb, Ur: 3.6 mg/dL — ABNORMAL HIGH (ref 0.0–1.9)

## 2022-03-31 LAB — HEMOGLOBIN A1C: Hgb A1c MFr Bld: 6.1 % (ref 4.6–6.5)

## 2022-03-31 MED ORDER — CYANOCOBALAMIN 1000 MCG/ML IJ SOLN
1000.0000 ug | Freq: Once | INTRAMUSCULAR | Status: AC
  Administered 2022-03-31: 1000 ug via INTRAMUSCULAR

## 2022-03-31 NOTE — Assessment & Plan Note (Signed)
Chronic   Lab Results  Component Value Date   HGBA1C 6.7 (H) 09/19/2021   Sugars controlled Check A1c, urine microalbumin today Continue Rybelsus 3 mg daily, metformin 500 mg daily Stressed regular exercise, diabetic diet

## 2022-03-31 NOTE — Assessment & Plan Note (Signed)
Chronic Regular exercise and healthy diet encouraged Check lipid panel  Continue pravastatin 20 mg daily 

## 2022-03-31 NOTE — Assessment & Plan Note (Signed)
Chronic BMI 39.14 with DM, htn Has been working on weight loss and has lost weight Continue efforts

## 2022-03-31 NOTE — Assessment & Plan Note (Signed)
Chronic Doing B12 injections monthly-continue B12 injection today

## 2022-03-31 NOTE — Assessment & Plan Note (Addendum)
Chronic Taking vitamin D daily-continue

## 2022-03-31 NOTE — Assessment & Plan Note (Signed)
Chronic Blood pressure well controlled CMP Continue amlodipine 5 mg daily, Coreg 6.25 mg twice daily

## 2022-04-18 ENCOUNTER — Ambulatory Visit (INDEPENDENT_AMBULATORY_CARE_PROVIDER_SITE_OTHER): Payer: Medicare Other

## 2022-04-18 VITALS — Ht 64.0 in | Wt 228.0 lb

## 2022-04-18 DIAGNOSIS — Z0000 Encounter for general adult medical examination without abnormal findings: Secondary | ICD-10-CM | POA: Diagnosis not present

## 2022-04-18 NOTE — Patient Instructions (Signed)
Paige Phillips , Thank you for taking time to come for your Medicare Wellness Visit. I appreciate your ongoing commitment to your health goals. Please review the following plan we discussed and let me know if I can assist you in the future.   These are the goals we discussed:  Goals      My goal for 2024 is to join the gym with my female neighbors and get more physical exercise.        This is a list of the screening recommended for you and due dates:  Health Maintenance  Topic Date Due   DTaP/Tdap/Td vaccine (1 - Tdap) Never done   Zoster (Shingles) Vaccine (1 of 2) Never done   COVID-19 Vaccine (4 - 2023-24 season) 11/08/2021   Eye exam for diabetics  03/14/2022   Medicare Annual Wellness Visit  04/17/2022   Complete foot exam   05/16/2022   Hemoglobin A1C  09/29/2022   Yearly kidney function blood test for diabetes  04/01/2023   Yearly kidney health urinalysis for diabetes  04/01/2023   Mammogram  06/26/2023   Colon Cancer Screening  10/06/2030   Pneumonia Vaccine  Completed   DEXA scan (bone density measurement)  Completed   Hepatitis C Screening: USPSTF Recommendation to screen - Ages 23-79 yo.  Completed   HPV Vaccine  Aged Out   Flu Shot  Discontinued    Advanced directives: No  Conditions/risks identified: Yes  Next appointment: Follow up in one year for your annual wellness visit.   Preventive Care 24 Years and Older, Female Preventive care refers to lifestyle choices and visits with your health care provider that can promote health and wellness. What does preventive care include? A yearly physical exam. This is also called an annual well check. Dental exams once or twice a year. Routine eye exams. Ask your health care provider how often you should have your eyes checked. Personal lifestyle choices, including: Daily care of your teeth and gums. Regular physical activity. Eating a healthy diet. Avoiding tobacco and drug use. Limiting alcohol use. Practicing safe  sex. Taking low-dose aspirin every day. Taking vitamin and mineral supplements as recommended by your health care provider. What happens during an annual well check? The services and screenings done by your health care provider during your annual well check will depend on your age, overall health, lifestyle risk factors, and family history of disease. Counseling  Your health care provider may ask you questions about your: Alcohol use. Tobacco use. Drug use. Emotional well-being. Home and relationship well-being. Sexual activity. Eating habits. History of falls. Memory and ability to understand (cognition). Work and work Statistician. Reproductive health. Screening  You may have the following tests or measurements: Height, weight, and BMI. Blood pressure. Lipid and cholesterol levels. These may be checked every 5 years, or more frequently if you are over 44 years old. Skin check. Lung cancer screening. You may have this screening every year starting at age 36 if you have a 30-pack-year history of smoking and currently smoke or have quit within the past 15 years. Fecal occult blood test (FOBT) of the stool. You may have this test every year starting at age 51. Flexible sigmoidoscopy or colonoscopy. You may have a sigmoidoscopy every 5 years or a colonoscopy every 10 years starting at age 64. Hepatitis C blood test. Hepatitis B blood test. Sexually transmitted disease (STD) testing. Diabetes screening. This is done by checking your blood sugar (glucose) after you have not eaten for a while (  fasting). You may have this done every 1-3 years. Bone density scan. This is done to screen for osteoporosis. You may have this done starting at age 5. Mammogram. This may be done every 1-2 years. Talk to your health care provider about how often you should have regular mammograms. Talk with your health care provider about your test results, treatment options, and if necessary, the need for more  tests. Vaccines  Your health care provider may recommend certain vaccines, such as: Influenza vaccine. This is recommended every year. Tetanus, diphtheria, and acellular pertussis (Tdap, Td) vaccine. You may need a Td booster every 10 years. Zoster vaccine. You may need this after age 59. Pneumococcal 13-valent conjugate (PCV13) vaccine. One dose is recommended after age 64. Pneumococcal polysaccharide (PPSV23) vaccine. One dose is recommended after age 74. Talk to your health care provider about which screenings and vaccines you need and how often you need them. This information is not intended to replace advice given to you by your health care provider. Make sure you discuss any questions you have with your health care provider. Document Released: 03/23/2015 Document Revised: 11/14/2015 Document Reviewed: 12/26/2014 Elsevier Interactive Patient Education  2017 [place] Prevention in the Home Falls can cause injuries. They can happen to people of all ages. There are many things you can do to make your home safe and to help prevent falls. What can I do on the outside of my home? Regularly fix the edges of walkways and driveways and fix any cracks. Remove anything that might make you trip as you walk through a door, such as a raised step or threshold. Trim any bushes or trees on the path to your home. Use bright outdoor lighting. Clear any walking paths of anything that might make someone trip, such as rocks or tools. Regularly check to see if handrails are loose or broken. Make sure that both sides of any steps have handrails. Any raised decks and porches should have guardrails on the edges. Have any leaves, snow, or ice cleared regularly. Use sand or salt on walking paths during winter. Clean up any spills in your garage right away. This includes oil or grease spills. What can I do in the bathroom? Use night lights. Install grab bars by the toilet and in the tub and shower.  Do not use towel bars as grab bars. Use non-skid mats or decals in the tub or shower. If you need to sit down in the shower, use a plastic, non-slip stool. Keep the floor dry. Clean up any water that spills on the floor as soon as it happens. Remove soap buildup in the tub or shower regularly. Attach bath mats securely with double-sided non-slip rug tape. Do not have throw rugs and other things on the floor that can make you trip. What can I do in the bedroom? Use night lights. Make sure that you have a light by your bed that is easy to reach. Do not use any sheets or blankets that are too big for your bed. They should not hang down onto the floor. Have a firm chair that has side arms. You can use this for support while you get dressed. Do not have throw rugs and other things on the floor that can make you trip. What can I do in the kitchen? Clean up any spills right away. Avoid walking on wet floors. Keep items that you use a lot in easy-to-reach places. If you need to reach something above you, use  a strong step stool that has a grab bar. Keep electrical cords out of the way. Do not use floor polish or wax that makes floors slippery. If you must use wax, use non-skid floor wax. Do not have throw rugs and other things on the floor that can make you trip. What can I do with my stairs? Do not leave any items on the stairs. Make sure that there are handrails on both sides of the stairs and use them. Fix handrails that are broken or loose. Make sure that handrails are as long as the stairways. Check any carpeting to make sure that it is firmly attached to the stairs. Fix any carpet that is loose or worn. Avoid having throw rugs at the top or bottom of the stairs. If you do have throw rugs, attach them to the floor with carpet tape. Make sure that you have a light switch at the top of the stairs and the bottom of the stairs. If you do not have them, ask someone to add them for you. What else  can I do to help prevent falls? Wear shoes that: Do not have high heels. Have rubber bottoms. Are comfortable and fit you well. Are closed at the toe. Do not wear sandals. If you use a stepladder: Make sure that it is fully opened. Do not climb a closed stepladder. Make sure that both sides of the stepladder are locked into place. Ask someone to hold it for you, if possible. Clearly mark and make sure that you can see: Any grab bars or handrails. First and last steps. Where the edge of each step is. Use tools that help you move around (mobility aids) if they are needed. These include: Canes. Walkers. Scooters. Crutches. Turn on the lights when you go into a dark area. Replace any light bulbs as soon as they burn out. Set up your furniture so you have a clear path. Avoid moving your furniture around. If any of your floors are uneven, fix them. If there are any pets around you, be aware of where they are. Review your medicines with your doctor. Some medicines can make you feel dizzy. This can increase your chance of falling. Ask your doctor what other things that you can do to help prevent falls. This information is not intended to replace advice given to you by your health care provider. Make sure you discuss any questions you have with your health care provider. Document Released: 12/21/2008 Document Revised: 08/02/2015 Document Reviewed: 03/31/2014 Elsevier Interactive Patient Education  2017 [employer].

## 2022-04-18 NOTE — Progress Notes (Signed)
I connected with Paige Phillips today by telephone and verified that I am speaking with the correct person using two identifiers. Location patient: home Location provider: work Persons participating in the virtual visit: patient, provider.   I discussed the limitations, risks, security and privacy concerns of performing an evaluation and management service by telephone and the availability of in person appointments. I also discussed with the patient that there may be a patient responsible charge related to this service. The patient expressed understanding and verbally consented to this telephonic visit.    Interactive audio and video telecommunications were attempted between this provider and patient, however failed, due to patient having technical difficulties OR patient did not have access to video capability.  We continued and completed visit with audio only.  Some vital signs may be absent or patient reported.   Time Spent with patient on telephone encounter: 30 minutes  Subjective:   Paige Phillips is a 72 y.o. female who presents for Medicare Annual (Subsequent) preventive examination.  Review of Systems     Cardiac Risk Factors include: advanced age (>43mn, >>52women);diabetes mellitus;hypertension     Objective:    Today's Vitals   04/18/22 1302  Weight: 228 lb (103.4 kg)  Height: 5' 4"$  (1.626 m)  PainSc: 0-No pain   Body mass index is 39.14 kg/m.     04/18/2022    1:04 PM 04/17/2021   12:39 PM 11/05/2020    2:07 PM 04/16/2020    1:12 PM 11/24/2019    8:09 AM  Advanced Directives  Does Patient Have a Medical Advance Directive? No No No No No  Would patient like information on creating a medical advance directive? No - Patient declined No - Patient declined Yes (MAU/Ambulatory/Procedural Areas - Information given) No - Patient declined     Current Medications (verified) Outpatient Encounter Medications as of 04/18/2022  Medication Sig   acetaminophen (TYLENOL) 650 MG  CR tablet Take 1,300 mg by mouth every 8 (eight) hours as needed for pain.   Alpha-Lipoic Acid 600 MG CAPS Take 600 mg by mouth 2 (two) times daily.   amLODipine (NORVASC) 5 MG tablet TAKE 1 TABLET(5 MG) BY MOUTH DAILY   carvedilol (COREG) 6.25 MG tablet Take 1 tablet (6.25 mg total) by mouth 2 (two) times daily with a meal. D/c bystolic due to cost, d/c 3.125   Cholecalciferol (HM VITAMIN D3) 100 MCG (4000 UT) CAPS Take 4,000 Units by mouth daily.   metFORMIN (GLUCOPHAGE) 500 MG tablet Take 1 tablet (500 mg total) by mouth daily with breakfast.   pravastatin (PRAVACHOL) 20 MG tablet Take 1 tablet (20 mg total) by mouth at bedtime.   Semaglutide (RYBELSUS) 3 MG TABS Take 3 mg by mouth daily.   terbinafine (LAMISIL AT) 1 % cream Apply 1 application. topically 2 (two) times daily. B/l feet   No facility-administered encounter medications on file as of 04/18/2022.    Allergies (verified) Losartan   History: Past Medical History:  Diagnosis Date   Cataract    OS>OD 03/14/21 [facility]   Chickenpox    DDD (degenerative disc disease), lumbar    Diabetes mellitus without complication (HCC)    GERD (gastroesophageal reflux disease)    Hypertension    Lumbar herniated disc    Spinal stenosis    Past Surgical History:  Procedure Laterality Date   ABDOMINAL HYSTERECTOMY  1983   fibroids    CARPAL TUNNEL RELEASE     rt   Family History  Problem  Relation Age of Onset   Cancer Father        pancreatic cancer    Cancer Sister        breast cancer age 2s   Diabetes Sister    Cancer Brother        pancreatic cancer   Stroke Brother        died stroke   ALS Mother        61s   Social History   Socioeconomic History   Marital status: Single    Spouse name: Not on file   Number of children: Not on file   Years of education: Not on file   Highest education level: Not on file  Occupational History   Not on file  Tobacco Use   Smoking status: Never   Smokeless tobacco: Never   Vaping Use   Vaping Use: Never used  Substance and Sexual Activity   Alcohol use: Yes    Comment: social   Drug use: Never   Sexual activity: Not Currently  Other Topics Concern   Not on file  Social History Narrative   [employer] worker part time    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    2 kids son and daughter Paige Phillips also my patient    No guns    Wears seat belt    Safe in relationship    Never smoker    Social Determinants of Health   Financial Resource Strain: Low Risk  (04/18/2022)   Overall Financial Resource Strain (CARDIA)    Difficulty of Paying Living Expenses: Not hard at all  Food Insecurity: No Food Insecurity (04/18/2022)   Hunger Vital Sign    Worried About Running Out of Food in the Last Year: Never true    [place] in the Last Year: Never true  Transportation Needs: No Transportation Needs (04/18/2022)   PRAPARE - Hydrologist (Medical): No    Lack of Transportation (Non-Medical): No  Physical Activity: Sufficiently Active (04/18/2022)   Exercise Vital Sign    Days of Exercise per Week: 5 days    Minutes of Exercise per Session: 30 min  Stress: No Stress Concern Present (04/18/2022)   [name]    Feeling of Stress : Not at all  Social Connections: Moderately Integrated (04/18/2022)   Social Connection and Isolation Panel [NHANES]    Frequency of Communication with Friends and Family: More than three times a week    Frequency of Social Gatherings with Friends and Family: More than three times a week    Attends Religious Services: 1 to 4 times per year    Active Member of [employer] or Organizations: Yes    Attends Archivist Meetings: 1 to 4 times per year    Marital Status: Never married    Tobacco Counseling Counseling given: Not Answered   Clinical Intake:  Pre-visit preparation completed: Yes  Pain : No/denies pain Pain Score: 0-No pain     BMI - recorded:  39.14 Nutritional Status: BMI > 30  Obese Nutritional Risks: None Diabetes: Yes CBG done?: No Did pt. bring in CBG monitor from home?: No  How often do you need to have someone help you when you read instructions, pamphlets, or other written materials from your doctor or pharmacy?: 1 - Never What is the last grade level you completed in school?: HSG  Nutrition Risk Assessment:  Has the patient had any  N/V/D within the last 2 months?  No  Does the patient have any non-healing wounds?  No  Has the patient had any unintentional weight loss or weight gain?  Yes   Diabetes:  Is the patient diabetic?  Yes  If diabetic, was a CBG obtained today?  No  Did the patient bring in their glucometer from home?  No  How often do you monitor your CBG's? none.   Financial Strains and Diabetes Management:  Are you having any financial strains with the device, your supplies or your medication? No .  Does the patient want to be seen by Chronic Care Management for management of their diabetes?  No  Would the patient like to be referred to a Nutritionist or for Diabetic Management?  No   Diabetic Exams:  Diabetic Eye Exam: Completed patient is scheduled for 09/2021 with [facility]. Diabetic Foot Exam: Completed 05/15/2021   Interpreter Needed?: No  Information entered by :: Suesan Mohrmann N. Keyondre Hepburn, LPN.   Activities of Daily Living    04/18/2022    1:07 PM  In your present state of health, do you have any difficulty performing the following activities:  Hearing? 0  Vision? 0  Difficulty concentrating or making decisions? 0  Walking or climbing stairs? 0  Dressing or bathing? 0  Doing errands, shopping? 0  Preparing Food and eating ? N  Using the Toilet? N  In the past six months, have you accidently leaked urine? N  Do you have problems with loss of bowel control? N  Managing your Medications? N  Managing your Finances? N  Housekeeping or managing your Housekeeping? N    Patient Care  Team: Binnie Rail, MD as PCP - General (Internal Medicine)  Indicate any recent Medical Services you may have received from other than [facility] providers in the past year (date may be approximate).     Assessment:   This is a routine wellness examination for Paige Phillips.  Hearing/Vision screen Hearing Screening - Comments:: Denies hearing difficulties.  Vision Screening - Comments:: No glasses/contacts; [facility] exam completed by [facility].  Dietary issues and exercise activities discussed: Current Exercise Habits: Home exercise routine, Type of exercise: walking, Time (Minutes): 30, Frequency (Times/Week): 5, Weekly Exercise (Minutes/Week): 150, Intensity: Mild, Exercise limited by: None identified   Goals Addressed             This Visit's Progress    My goal for 2024 is to join the gym with my female neighbors and get more physical exercise.        Depression Screen    03/31/2022    9:34 AM 09/19/2021    9:20 AM 04/17/2021   12:38 PM 11/05/2020    2:07 PM 04/16/2020    1:11 PM 02/18/2019   10:50 AM 12/25/2017    9:35 AM  PHQ 2/9 Scores  PHQ - 2 Score 0 0 0 0 0 0 0  PHQ- 9 Score 0          Fall Risk    04/18/2022    1:05 PM 03/31/2022    9:33 AM 09/19/2021    9:19 AM 04/17/2021   12:56 PM 11/05/2020    2:07 PM  [place] in the past year? 0 0 0 0 0  Number falls in past yr: 0 0 0 0   Injury with Fall? 0 0 0    Risk for fall due to : No Fall Risks No Fall Risks No  Fall Risks    Follow up Falls prevention discussed Falls evaluation completed Falls evaluation completed Falls evaluation completed     [place]:  Any stairs in or around the home? Yes  If so, are there any without handrails? No  Home free of loose throw rugs in walkways, pet beds, electrical cords, etc? Yes  Adequate lighting in your home to reduce risk of falls? Yes   ASSISTIVE DEVICES UTILIZED TO PREVENT FALLS:  Life alert? No  Use of a cane, walker or w/c? No   Grab bars in the bathroom? No  Shower chair or bench in shower? Yes  Elevated toilet seat or a handicapped toilet? No   TIMED UP AND GO:  Was the test performed? No . Phone Visit  Cognitive Function:        04/18/2022    1:14 PM  6CIT Screen  What Year? 0 points  What month? 0 points  What time? 0 points  Count back from 20 0 points  Months in reverse 0 points  Repeat phrase 0 points  Total Score 0 points    Immunizations Immunization History  Administered Date(s) Administered   Fluad Quad(high Dose 65+) 02/22/2020, 05/15/2021   Moderna Sars-Covid-2 Vaccination 04/23/2019, 05/12/2019, 02/22/2020   PNEUMOCOCCAL CONJUGATE-20 09/19/2021    TDAP status: Declined, Education has been provided regarding the importance of this vaccine but patient still declined. Advised may receive this vaccine at local pharmacy or Health Dept. Aware to provide a copy of the vaccination record if obtained from local pharmacy or Health Dept. Verbalized acceptance and understanding.  Flu Vaccine status: Declined, Education has been provided regarding the importance of this vaccine but patient still declined. Advised may receive this vaccine at local pharmacy or Health Dept. Aware to provide a copy of the vaccination record if obtained from local pharmacy or Health Dept. Verbalized acceptance and understanding.  Pneumococcal vaccine status: Declined,  Education has been provided regarding the importance of this vaccine but patient still declined. Advised may receive this vaccine at local pharmacy or Health Dept. Aware to provide a copy of the vaccination record if obtained from local pharmacy or Health Dept. Verbalized acceptance and understanding.   Covid-19 vaccine status: Completed vaccines  Qualifies for Shingles Vaccine? Yes   Zostavax completed No   Shingrix Completed?: No.    Education has been provided regarding the importance of this vaccine. Patient has been advised to call insurance company  to determine out of pocket expense if they have not yet received this vaccine. Advised may also receive vaccine at local pharmacy or Health Dept. Verbalized acceptance and understanding.  Screening Tests Health Maintenance  Topic Date Due   DTaP/Tdap/Td (1 - Tdap) Never done   Zoster Vaccines- Shingrix (1 of 2) Never done   COVID-19 Vaccine (4 - 2023-24 season) 11/08/2021   OPHTHALMOLOGY EXAM  03/14/2022   FOOT EXAM  05/16/2022   HEMOGLOBIN A1C  09/29/2022   Diabetic kidney evaluation - eGFR measurement  04/01/2023   Diabetic kidney evaluation - Urine ACR  04/01/2023   Medicare Annual Wellness (AWV)  04/19/2023   MAMMOGRAM  06/26/2023   COLONOSCOPY (Pts 45-11yr Insurance coverage will need to be confirmed)  10/06/2030   Pneumonia Vaccine 72 Years old  Completed   DEXA SCAN  Completed   Hepatitis C Screening  Completed   HPV VACCINES  Aged Out   INFLUENZA VACCINE  Discontinued    Health Maintenance  Health Maintenance Due  Topic  Date Due   DTaP/Tdap/Td (1 - Tdap) Never done   Zoster Vaccines- Shingrix (1 of 2) Never done   COVID-19 Vaccine (4 - 2023-24 season) 11/08/2021   OPHTHALMOLOGY EXAM  03/14/2022    Colorectal cancer screening: Type of screening: Colonoscopy. Completed 10/05/2020. Repeat every 10 years  Mammogram status: Completed 07/06/2021. Repeat every year  Bone Density status: Completed 02/03/2018. Results reflect: Bone density results: NORMAL. Repeat every 5 years.  Lung Cancer Screening: (Low Dose CT Chest recommended if Age 53-80 years, 30 pack-year currently smoking OR have quit w/in 15years.) does not qualify.   Lung Cancer Screening Referral: no  Additional Screening:  Hepatitis C Screening: does qualify; Completed 12/25/2017  Vision Screening: Recommended annual ophthalmology exams for early detection of glaucoma and other disorders of the eye. Is the patient up to date with their annual eye exam?  Yes  Who is the provider or what is the name of the  office in which the patient attends annual eye exams? [facility] If pt is not established with a provider, would they like to be referred to a provider to establish care? No .   Dental Screening: Recommended annual dental exams for proper oral hygiene  Community Resource Referral / Chronic Care Management: CRR required this visit?  No   CCM required this visit?  No      Plan:     I have personally reviewed and noted the following in the patient's chart:   Medical and social history Use of alcohol, tobacco or illicit drugs  Current medications and supplements including opioid prescriptions. Patient is not currently taking opioid prescriptions. Functional ability and status Nutritional status Physical activity Advanced directives List of other physicians Hospitalizations, surgeries, and ER visits in previous 12 months Vitals Screenings to include cognitive, depression, and falls Referrals and appointments  In addition, I have reviewed and discussed with patient certain preventive protocols, quality metrics, and best practice recommendations. A written personalized care plan for preventive services as well as general preventive health recommendations were provided to patient.     Sheral Flow, LPN   [id number]   Nurse Notes: N/A

## 2022-04-28 NOTE — Progress Notes (Signed)
Subjective:    Paige Phillips is a 72 y.o. female who presents for Medicare Annual/Subsequent preventive examination.  Depression Screen (Note: if answer to either of the following is "Yes", a more complete depression screening is indicated)   Over the past two weeks, have you felt down, depressed or hopeless? No  Over the past two weeks, have you felt little interest or pleasure in doing things? No  Have you lost interest or pleasure in daily life? No  Do you often feel hopeless? No  Do you cry easily over simple problems? No   All answers were reviewed with the patient and necessary referrals were made:  Mickeal Needy, LPN   9/81/1914

## 2022-05-01 ENCOUNTER — Ambulatory Visit (INDEPENDENT_AMBULATORY_CARE_PROVIDER_SITE_OTHER): Payer: Medicare Other

## 2022-05-01 DIAGNOSIS — E538 Deficiency of other specified B group vitamins: Secondary | ICD-10-CM | POA: Diagnosis not present

## 2022-05-01 MED ORDER — CYANOCOBALAMIN 1000 MCG/ML IJ SOLN
1000.0000 ug | Freq: Once | INTRAMUSCULAR | Status: AC
  Administered 2022-05-01: 1000 ug via INTRAMUSCULAR

## 2022-05-01 NOTE — Progress Notes (Signed)
Pt here for monthly B12 injection per Dr. Quay Burow  B12 1017mg given IM, and pt tolerated injection well.  Next B12 injection scheduled for 05/30/22

## 2022-05-30 ENCOUNTER — Ambulatory Visit (INDEPENDENT_AMBULATORY_CARE_PROVIDER_SITE_OTHER): Payer: Medicare Other

## 2022-05-30 DIAGNOSIS — E538 Deficiency of other specified B group vitamins: Secondary | ICD-10-CM

## 2022-05-30 MED ORDER — CYANOCOBALAMIN 1000 MCG/ML IJ SOLN
1000.0000 ug | Freq: Once | INTRAMUSCULAR | Status: AC
  Administered 2022-05-30: 1000 ug via INTRAMUSCULAR

## 2022-05-30 NOTE — Progress Notes (Signed)
Pt was given B12 injection w/o any complications. 

## 2022-07-02 ENCOUNTER — Ambulatory Visit (INDEPENDENT_AMBULATORY_CARE_PROVIDER_SITE_OTHER): Payer: Medicare Other | Admitting: *Deleted

## 2022-07-02 DIAGNOSIS — E538 Deficiency of other specified B group vitamins: Secondary | ICD-10-CM | POA: Diagnosis not present

## 2022-07-02 MED ORDER — CYANOCOBALAMIN 1000 MCG/ML IJ SOLN
1000.0000 ug | Freq: Once | INTRAMUSCULAR | Status: AC
  Administered 2022-07-02: 1000 ug via INTRAMUSCULAR

## 2022-07-02 NOTE — Progress Notes (Signed)
Pls cosign for B12 inj../lmb  

## 2022-08-01 ENCOUNTER — Ambulatory Visit (INDEPENDENT_AMBULATORY_CARE_PROVIDER_SITE_OTHER): Payer: Medicare Other | Admitting: Radiology

## 2022-08-01 DIAGNOSIS — E538 Deficiency of other specified B group vitamins: Secondary | ICD-10-CM | POA: Diagnosis not present

## 2022-08-01 MED ORDER — CYANOCOBALAMIN 1000 MCG/ML IJ SOLN
1000.0000 ug | Freq: Once | INTRAMUSCULAR | Status: AC
  Administered 2022-08-01: 1000 ug via INTRAMUSCULAR

## 2022-08-01 NOTE — Progress Notes (Signed)
Pt here for monthly B12 injection per Dr. Lawerance Bach  B12 given IM Left arm, and pt tolerated injection well.

## 2022-09-03 ENCOUNTER — Ambulatory Visit (INDEPENDENT_AMBULATORY_CARE_PROVIDER_SITE_OTHER): Payer: Medicare Other | Admitting: *Deleted

## 2022-09-03 DIAGNOSIS — E538 Deficiency of other specified B group vitamins: Secondary | ICD-10-CM

## 2022-09-03 MED ORDER — CYANOCOBALAMIN 1000 MCG/ML IJ SOLN
1000.0000 ug | Freq: Once | INTRAMUSCULAR | Status: AC
  Administered 2022-09-03: 1000 ug via INTRAMUSCULAR

## 2022-09-03 NOTE — Progress Notes (Signed)
Pls cosign for B12 inj../lmb  

## 2022-09-24 ENCOUNTER — Other Ambulatory Visit: Payer: Self-pay | Admitting: Internal Medicine

## 2022-09-24 DIAGNOSIS — I152 Hypertension secondary to endocrine disorders: Secondary | ICD-10-CM

## 2022-09-29 ENCOUNTER — Ambulatory Visit (INDEPENDENT_AMBULATORY_CARE_PROVIDER_SITE_OTHER): Payer: Medicare Other

## 2022-09-29 ENCOUNTER — Ambulatory Visit: Payer: Medicare Other | Admitting: Internal Medicine

## 2022-09-29 DIAGNOSIS — E538 Deficiency of other specified B group vitamins: Secondary | ICD-10-CM | POA: Diagnosis not present

## 2022-09-29 MED ORDER — CYANOCOBALAMIN 1000 MCG/ML IJ SOLN
1000.0000 ug | Freq: Once | INTRAMUSCULAR | Status: AC
  Administered 2022-09-29: 1000 ug via INTRAMUSCULAR

## 2022-09-29 NOTE — Progress Notes (Signed)
Patient here for monthly B12 injection per Dr. Burns. B12 1000 mcg given in left IM and patient tolerated injection well today.  

## 2022-10-08 ENCOUNTER — Encounter (INDEPENDENT_AMBULATORY_CARE_PROVIDER_SITE_OTHER): Payer: Self-pay

## 2022-10-09 ENCOUNTER — Encounter: Payer: Self-pay | Admitting: Internal Medicine

## 2022-10-09 NOTE — Patient Instructions (Addendum)
Blood work was ordered.   The lab is on the first floor.    Medications changes include :   none      Return in about 6 months (around 04/12/2023) for follow up.    Health Maintenance, Female Adopting a healthy lifestyle and getting preventive care are important in promoting health and wellness. Ask your health care provider about: The right schedule for you to have regular tests and exams. Things you can do on your own to prevent diseases and keep yourself healthy. What should I know about diet, weight, and exercise? Eat a healthy diet  Eat a diet that includes plenty of vegetables, fruits, low-fat dairy products, and lean protein. Do not eat a lot of foods that are high in solid fats, added sugars, or sodium. Maintain a healthy weight Body mass index (BMI) is used to identify weight problems. It estimates body fat based on height and weight. Your health care provider can help determine your BMI and help you achieve or maintain a healthy weight. Get regular exercise Get regular exercise. This is one of the most important things you can do for your health. Most adults should: Exercise for at least 150 minutes each week. The exercise should increase your heart rate and make you sweat (moderate-intensity exercise). Do strengthening exercises at least twice a week. This is in addition to the moderate-intensity exercise. Spend less time sitting. Even light physical activity can be beneficial. Watch cholesterol and blood lipids Have your blood tested for lipids and cholesterol at 71 years of age, then have this test every 5 years. Have your cholesterol levels checked more often if: Your lipid or cholesterol levels are high. You are older than 71 years of age. You are at high risk for heart disease. What should I know about cancer screening? Depending on your health history and family history, you may need to have cancer screening at various ages. This may include screening  for: Breast cancer. Cervical cancer. Colorectal cancer. Skin cancer. Lung cancer. What should I know about heart disease, diabetes, and high blood pressure? Blood pressure and heart disease High blood pressure causes heart disease and increases the risk of stroke. This is more likely to develop in people who have high blood pressure readings or are overweight. Have your blood pressure checked: Every 3-5 years if you are 9-80 years of age. Every year if you are 61 years old or older. Diabetes Have regular diabetes screenings. This checks your fasting blood sugar level. Have the screening done: Once every three years after age 50 if you are at a normal weight and have a low risk for diabetes. More often and at a younger age if you are overweight or have a high risk for diabetes. What should I know about preventing infection? Hepatitis B If you have a higher risk for hepatitis B, you should be screened for this virus. Talk with your health care provider to find out if you are at risk for hepatitis B infection. Hepatitis C Testing is recommended for: Everyone born from [birth year] through 1964. Anyone with known risk factors for hepatitis C. Sexually transmitted infections (STIs) Get screened for STIs, including gonorrhea and chlamydia, if: You are sexually active and are younger than 71 years of age. You are older than 71 years of age and your health care provider tells you that you are at risk for this type of infection. Your sexual activity has changed since you were last screened, and you  are at increased risk for chlamydia or gonorrhea. Ask your health care provider if you are at risk. Ask your health care provider about whether you are at high risk for HIV. Your health care provider may recommend a prescription medicine to help prevent HIV infection. If you choose to take medicine to prevent HIV, you should first get tested for HIV. You should then be tested every 3 months for as long as you  are taking the medicine. Pregnancy If you are about to stop having your period (premenopausal) and you may become pregnant, seek counseling before you get pregnant. Take 400 to 800 micrograms (mcg) of folic acid every day if you become pregnant. Ask for birth control (contraception) if you want to prevent pregnancy. Osteoporosis and menopause Osteoporosis is a disease in which the bones lose minerals and strength with aging. This can result in bone fractures. If you are 60 years old or older, or if you are at risk for osteoporosis and fractures, ask your health care provider if you should: Be screened for bone loss. Take a calcium or vitamin D supplement to lower your risk of fractures. Be given hormone replacement therapy (HRT) to treat symptoms of menopause. Follow these instructions at home: Alcohol use Do not drink alcohol if: Your health care provider tells you not to drink. You are pregnant, may be pregnant, or are planning to become pregnant. If you drink alcohol: Limit how much you have to: 0-1 drink a day. Know how much alcohol is in your drink. In the [place] one drink equals one 12 oz bottle of beer (355 mL), one 5 oz glass of wine (148 mL), or one 1 oz glass of hard liquor (44 mL). Lifestyle Do not use any products that contain nicotine or tobacco. These products include cigarettes, chewing tobacco, and vaping devices, such as e-cigarettes. If you need help quitting, ask your health care provider. Do not use street drugs. Do not share needles. Ask your health care provider for help if you need support or information about quitting drugs. General instructions Schedule regular health, dental, and eye exams. Stay current with your vaccines. Tell your health care provider if: You often feel depressed. You have ever been abused or do not feel safe at home. Summary Adopting a healthy lifestyle and getting preventive care are important in promoting health and wellness. Follow your  health care provider's instructions about healthy diet, exercising, and getting tested or screened for diseases. Follow your health care provider's instructions on monitoring your cholesterol and blood pressure. This information is not intended to replace advice given to you by your health care provider. Make sure you discuss any questions you have with your health care provider. Document Revised: 07/16/2020 Document Reviewed: 07/16/2020 Elsevier Patient Education  2024 [employer].

## 2022-10-09 NOTE — Progress Notes (Signed)
Subjective:    Patient ID: Paige Phillips, female    DOB: 1950-03-28, 72 y.o.   MRN: [id number]      HPI Devon is here for a Physical exam and her chronic medical problems.   No concerns.    Medications and allergies reviewed with patient and updated if appropriate.  Current Outpatient Medications on File Prior to Visit  Medication Sig Dispense Refill   acetaminophen (TYLENOL) 650 MG CR tablet Take 1,300 mg by mouth every 8 (eight) hours as needed for pain.     Alpha-Lipoic Acid 600 MG CAPS Take 600 mg by mouth 2 (two) times daily.     amLODipine (NORVASC) 5 MG tablet TAKE 1 TABLET(5 MG) BY MOUTH DAILY 90 tablet 3   carvedilol (COREG) 6.25 MG tablet Take 1 tablet (6.25 mg total) by mouth 2 (two) times daily with a meal. D/c bystolic due to cost, d/c 3.125 180 tablet 3   Cholecalciferol (HM VITAMIN D3) 100 MCG (4000 UT) CAPS Take 4,000 Units by mouth daily.     metFORMIN (GLUCOPHAGE) 500 MG tablet Take 1 tablet (500 mg total) by mouth daily with breakfast. 90 tablet 3   pravastatin (PRAVACHOL) 20 MG tablet Take 1 tablet (20 mg total) by mouth at bedtime. 90 tablet 3   Semaglutide (RYBELSUS) 3 MG TABS Take 3 mg by mouth daily. 30 tablet 5   terbinafine (LAMISIL AT) 1 % cream Apply 1 application. topically 2 (two) times daily. B/l feet 42 g 11   No current facility-administered medications on file prior to visit.    Review of Systems  Constitutional:  Negative for fever.  Eyes:  Negative for visual disturbance.  Respiratory:  Negative for cough, shortness of breath and wheezing.   Cardiovascular:  Negative for chest pain, palpitations and leg swelling.  Gastrointestinal:  Negative for abdominal pain, blood in stool, constipation and diarrhea.       No gerd  Genitourinary:  Negative for dysuria.  Musculoskeletal:  Positive for back pain. Negative for arthralgias.       Muscle cramps at night in legs  Skin:  Negative for rash.  Neurological:  Positive for numbness  (feet). Negative for light-headedness and headaches.  Psychiatric/Behavioral:  Negative for dysphoric mood. The patient is not nervous/anxious.        Objective:   Vitals:   10/10/22 1434  BP: 136/88  Pulse: 60  Resp: 18  Temp: 98.3 F (36.8 C)   Filed Weights   10/10/22 1434  Weight: 244 lb (110.7 kg)   Body mass index is 41.88 kg/m.  BP Readings from Last 3 Encounters:  10/10/22 136/88  03/31/22 138/70  12/23/21 134/78    Wt Readings from Last 3 Encounters:  10/10/22 244 lb (110.7 kg)  04/18/22 228 lb (103.4 kg)  03/31/22 228 lb (103.4 kg)       Physical Exam Constitutional: She appears well-developed and well-nourished. No distress.  HENT:  Head: Normocephalic and atraumatic.  Right Ear: External ear normal. Normal ear canal and TM Left Ear: External ear normal.  Normal ear canal and TM Mouth/Throat: Oropharynx is clear and moist.  Eyes: Conjunctivae normal.  Neck: Neck supple. No tracheal deviation present. No thyromegaly present.  No carotid bruit  Cardiovascular: Normal rate, regular rhythm and normal heart sounds.   No murmur heard.  No edema. Pulmonary/Chest: Effort normal and breath sounds normal. No respiratory distress. She has no wheezes. She has no rales.  Breast: deferred   Abdominal: Soft.  She exhibits no distension. There is no tenderness.  Lymphadenopathy: She has no cervical adenopathy.  Skin: Skin is warm and dry. She is not diaphoretic.  Psychiatric: She has a normal mood and affect. Her behavior is normal.   Diabetic Foot Exam - Simple   Simple Foot Form Diabetic Foot exam was performed with the following findings: Yes   Visual Inspection No deformities, no ulcerations, no other skin breakdown bilaterally: Yes Sensation Testing Intact to touch and monofilament testing bilaterally: Yes Pulse Check Posterior Tibialis and Dorsalis pulse intact bilaterally: Yes Comments      Lab Results  Component Value Date   WBC 10.4 03/31/2022    HGB 13.8 03/31/2022   HCT 42.1 03/31/2022   PLT 327.0 03/31/2022   GLUCOSE 120 (H) 03/31/2022   CHOL 147 03/31/2022   TRIG 89.0 03/31/2022   HDL 45.50 03/31/2022   LDLCALC 84 03/31/2022   ALT 18 03/31/2022   AST 16 03/31/2022   NA 139 03/31/2022   K 3.9 03/31/2022   CL 103 03/31/2022   CREATININE 0.87 03/31/2022   BUN 15 03/31/2022   CO2 26 03/31/2022   TSH 0.84 05/15/2021   HGBA1C 6.1 03/31/2022   MICROALBUR 3.6 (H) 03/31/2022         Assessment & Plan:   Physical exam: Screening blood work  ordered Exercise  none - limited by back pain Weight  - has gained weight  - encouraged weight loss Substance abuse  none   Reviewed recommended immunizations.   Health Maintenance  Topic Date Due   DTaP/Tdap/Td (1 - Tdap) Never done   Zoster Vaccines- Shingrix (1 of 2) Never done   COVID-19 Vaccine (4 - 2023-24 season) 11/08/2021   OPHTHALMOLOGY EXAM  03/14/2022   FOOT EXAM  05/16/2022   HEMOGLOBIN A1C  09/29/2022   Diabetic kidney evaluation - eGFR measurement  04/01/2023   Diabetic kidney evaluation - Urine ACR  04/01/2023   Medicare Annual Wellness (AWV)  04/19/2023   MAMMOGRAM  06/26/2023   Colonoscopy  10/06/2030   Pneumonia Vaccine 61+ Years old  Completed   DEXA SCAN  Completed   Hepatitis C Screening  Completed   HPV VACCINES  Aged Out   INFLUENZA VACCINE  Discontinued          See Problem List for Assessment and Plan of chronic medical problems.

## 2022-10-10 ENCOUNTER — Ambulatory Visit (INDEPENDENT_AMBULATORY_CARE_PROVIDER_SITE_OTHER): Payer: Medicare Other | Admitting: Internal Medicine

## 2022-10-10 VITALS — BP 136/88 | HR 60 | Temp 98.3°F | Resp 18 | Ht 64.0 in | Wt 244.0 lb

## 2022-10-10 DIAGNOSIS — E785 Hyperlipidemia, unspecified: Secondary | ICD-10-CM | POA: Diagnosis not present

## 2022-10-10 DIAGNOSIS — E1169 Type 2 diabetes mellitus with other specified complication: Secondary | ICD-10-CM | POA: Diagnosis not present

## 2022-10-10 DIAGNOSIS — Z7984 Long term (current) use of oral hypoglycemic drugs: Secondary | ICD-10-CM

## 2022-10-10 DIAGNOSIS — Z0000 Encounter for general adult medical examination without abnormal findings: Secondary | ICD-10-CM

## 2022-10-10 DIAGNOSIS — E875 Hyperkalemia: Secondary | ICD-10-CM

## 2022-10-10 DIAGNOSIS — E1159 Type 2 diabetes mellitus with other circulatory complications: Secondary | ICD-10-CM

## 2022-10-10 DIAGNOSIS — I152 Hypertension secondary to endocrine disorders: Secondary | ICD-10-CM

## 2022-10-10 DIAGNOSIS — E538 Deficiency of other specified B group vitamins: Secondary | ICD-10-CM

## 2022-10-10 DIAGNOSIS — E559 Vitamin D deficiency, unspecified: Secondary | ICD-10-CM

## 2022-10-10 DIAGNOSIS — E119 Type 2 diabetes mellitus without complications: Secondary | ICD-10-CM

## 2022-10-10 LAB — COMPREHENSIVE METABOLIC PANEL
ALT: 16 U/L (ref 0–35)
AST: 17 U/L (ref 0–37)
Albumin: 4.8 g/dL (ref 3.5–5.2)
Alkaline Phosphatase: 93 U/L (ref 39–117)
BUN: 13 mg/dL (ref 6–23)
CO2: 26 mEq/L (ref 19–32)
Calcium: 10.2 mg/dL (ref 8.4–10.5)
Chloride: 102 mEq/L (ref 96–112)
Creatinine, Ser: 1.02 mg/dL (ref 0.40–1.20)
GFR: 55.37 mL/min — ABNORMAL LOW (ref >60.00)
Glucose, Bld: 136 mg/dL — ABNORMAL HIGH (ref 70–99)
Potassium: 4.5 mEq/L (ref 3.5–5.1)
Sodium: 138 mEq/L (ref 135–145)
Total Bilirubin: 0.7 mg/dL (ref 0.2–1.2)
Total Protein: 9.1 g/dL — ABNORMAL HIGH (ref 6.0–8.3)

## 2022-10-10 LAB — CBC WITH DIFFERENTIAL/PLATELET
Basophils Absolute: 0 10*3/uL (ref 0.0–0.1)
Basophils Relative: 0.2 % (ref 0.0–3.0)
Eosinophils Absolute: 0 10*3/uL (ref 0.0–0.7)
Eosinophils Relative: 0.4 % (ref 0.0–5.0)
HCT: 44.3 % (ref 36.0–46.0)
Hemoglobin: 13.9 g/dL (ref 12.0–15.0)
Lymphocytes Relative: 18.3 % (ref 12.0–46.0)
Lymphs Abs: 1.8 10*3/uL (ref 0.7–4.0)
MCHC: 31.4 g/dL (ref 30.0–36.0)
MCV: 90.3 fl (ref 78.0–100.0)
Monocytes Absolute: 0.5 10*3/uL (ref 0.1–1.0)
Monocytes Relative: 5.2 % (ref 3.0–12.0)
Neutro Abs: 7.6 10*3/uL (ref 1.4–7.7)
Neutrophils Relative %: 75.9 % (ref 43.0–77.0)
Platelets: 315 10*3/uL (ref 150.0–400.0)
RBC: 4.91 Mil/uL (ref 3.87–5.11)
RDW: 14.9 % (ref 11.5–15.5)
WBC: 10.1 10*3/uL (ref 4.0–10.5)

## 2022-10-10 LAB — HEMOGLOBIN A1C: Hgb A1c MFr Bld: 6.8 % — ABNORMAL HIGH (ref 4.6–6.5)

## 2022-10-10 LAB — LIPID PANEL
Cholesterol: 182 mg/dL (ref 0–200)
HDL: 51.9 mg/dL (ref >39.00)
LDL Cholesterol: 110 mg/dL — ABNORMAL HIGH (ref 0–99)
NonHDL: 129.66
Total CHOL/HDL Ratio: 3
Triglycerides: 100 mg/dL (ref 0.0–149.0)
VLDL: 20 mg/dL (ref 0.0–40.0)

## 2022-10-10 MED ORDER — RYBELSUS 3 MG PO TABS: 3.0000 mg | ORAL_TABLET | Freq: Every day | ORAL | 5 refills | Status: DC

## 2022-10-10 MED ORDER — PRAVASTATIN SODIUM 20 MG PO TABS
20.0000 mg | ORAL_TABLET | Freq: Every day | ORAL | 3 refills | Status: DC
Start: 2022-10-10 — End: 2023-10-05

## 2022-10-10 NOTE — Assessment & Plan Note (Signed)
Chronic Doing B12 injections monthly-continue

## 2022-10-10 NOTE — Assessment & Plan Note (Signed)
Chronic BMI 4129 with DM, htn Has gained weight since her last visit Try to be as active as possible Healthy diet, decreased portions

## 2022-10-10 NOTE — Assessment & Plan Note (Signed)
Chronic Taking vitamin D daily-continue  

## 2022-10-10 NOTE — Assessment & Plan Note (Signed)
Chronic ?Regular exercise and healthy diet encouraged ?Check lipid panel  ?Continue pravastatin 20 mg daily ?

## 2022-10-10 NOTE — Assessment & Plan Note (Signed)
Chronic   Lab Results  Component Value Date   HGBA1C 6.8 (H) 10/10/2022   Sugars controlled Check A1c today Continue Rybelsus 3 mg daily, metformin 500 mg daily Rybelsus was expensive when she picked it up last - if expensive again would consider increasing metformin and stopping rybelsus Stressed regular exercise ( limited by back pain), diabetic diet

## 2022-10-10 NOTE — Assessment & Plan Note (Addendum)
Chronic Blood pressure well controlled CMP, cbc Continue amlodipine 5 mg daily, Coreg 6.25 mg twice daily

## 2022-10-30 ENCOUNTER — Ambulatory Visit (INDEPENDENT_AMBULATORY_CARE_PROVIDER_SITE_OTHER): Payer: Medicare Other | Admitting: *Deleted

## 2022-10-30 DIAGNOSIS — E538 Deficiency of other specified B group vitamins: Secondary | ICD-10-CM

## 2022-10-30 MED ORDER — CYANOCOBALAMIN 1000 MCG/ML IJ SOLN
1000.0000 ug | Freq: Once | INTRAMUSCULAR | Status: AC
Start: 2022-10-30 — End: 2022-10-30
  Administered 2022-10-30: 1000 ug via INTRAMUSCULAR

## 2022-10-30 NOTE — Progress Notes (Signed)
Pls cosign for B12 inj../lmb  

## 2022-12-01 ENCOUNTER — Ambulatory Visit (INDEPENDENT_AMBULATORY_CARE_PROVIDER_SITE_OTHER): Payer: Medicare Other

## 2022-12-01 DIAGNOSIS — E538 Deficiency of other specified B group vitamins: Secondary | ICD-10-CM

## 2022-12-01 MED ORDER — CYANOCOBALAMIN 1000 MCG/ML IJ SOLN
1000.0000 ug | Freq: Once | INTRAMUSCULAR | Status: AC
Start: 2022-12-01 — End: 2022-12-01
  Administered 2022-12-01: 1000 ug via INTRAMUSCULAR

## 2022-12-01 NOTE — Progress Notes (Signed)
Pt here for monthly B12 injection per Dr. Lawerance Bach   B12 given IM and pt tolerated injection well.  Next B12 injection scheduled for 10/23  Patient was advised to report to the office immediately if she noticed any adverse reactions.

## 2022-12-31 ENCOUNTER — Ambulatory Visit (INDEPENDENT_AMBULATORY_CARE_PROVIDER_SITE_OTHER): Payer: Medicare Other | Admitting: Radiology

## 2022-12-31 DIAGNOSIS — E538 Deficiency of other specified B group vitamins: Secondary | ICD-10-CM | POA: Diagnosis not present

## 2022-12-31 MED ORDER — CYANOCOBALAMIN 1000 MCG/ML IJ SOLN
1000.0000 ug | Freq: Once | INTRAMUSCULAR | Status: AC
Start: 2022-12-31 — End: 2022-12-31
  Administered 2022-12-31: 1000 ug via INTRAMUSCULAR

## 2022-12-31 NOTE — Progress Notes (Signed)
Patient here for monthly B12 injection. Patient tolerated well with no complications.

## 2023-01-14 ENCOUNTER — Other Ambulatory Visit: Payer: Self-pay

## 2023-01-14 ENCOUNTER — Telehealth: Payer: Self-pay | Admitting: Internal Medicine

## 2023-01-14 DIAGNOSIS — E119 Type 2 diabetes mellitus without complications: Secondary | ICD-10-CM

## 2023-01-14 LAB — HM DIABETES EYE EXAM

## 2023-01-14 MED ORDER — METFORMIN HCL 500 MG PO TABS: 500.0000 mg | ORAL_TABLET | Freq: Every day | ORAL | 3 refills | Status: DC

## 2023-01-14 NOTE — Telephone Encounter (Signed)
Refill sent in today

## 2023-01-14 NOTE — Telephone Encounter (Signed)
Previous PCP was the last provider to fill below medication.   Prescription Request  01/14/2023  LOV: 10/10/2022  What is the name of the medication or equipment? metFORMIN (GLUCOPHAGE) 500 MG tablet   Have you contacted your pharmacy to request a refill? Yes   Which pharmacy would you like this sent to?  [id number] [place],  - [address] AT [facility] [address] & [facility] [address] [place] [address] Phone: [phone] Fax: [phone]    Patient notified that their request is being sent to the clinical staff for review and that they should receive a response within 2 business days.   Please advise at Mobile [phone] (mobile)

## 2023-01-30 ENCOUNTER — Ambulatory Visit (INDEPENDENT_AMBULATORY_CARE_PROVIDER_SITE_OTHER): Payer: Medicare Other

## 2023-01-30 DIAGNOSIS — E538 Deficiency of other specified B group vitamins: Secondary | ICD-10-CM | POA: Diagnosis not present

## 2023-01-30 MED ORDER — CYANOCOBALAMIN 1000 MCG/ML IJ SOLN
1000.0000 ug | Freq: Once | INTRAMUSCULAR | Status: AC
  Administered 2023-01-30: 1000 ug via INTRAMUSCULAR

## 2023-01-30 NOTE — Progress Notes (Signed)
PT visits today for their b-12 injection. PT notified of what they received and tolerated injection well. PT notified to reach back out to office if needed.

## 2023-03-02 ENCOUNTER — Ambulatory Visit (INDEPENDENT_AMBULATORY_CARE_PROVIDER_SITE_OTHER): Payer: Medicare Other

## 2023-03-02 DIAGNOSIS — E538 Deficiency of other specified B group vitamins: Secondary | ICD-10-CM

## 2023-03-02 MED ORDER — CYANOCOBALAMIN 1000 MCG/ML IJ SOLN
1000.0000 ug | Freq: Once | INTRAMUSCULAR | Status: AC
  Administered 2023-03-02: 1000 ug via INTRAMUSCULAR

## 2023-03-02 NOTE — Progress Notes (Signed)
After obtaining consent, and per orders of Dr. Lawerance Bach, injection of B12 given by Ferdie Ping. Patient instructed to remain in clinic for 20 minutes afterwards, and to report any adverse reaction to me immediately.

## 2023-04-02 ENCOUNTER — Ambulatory Visit: Payer: Medicare Other

## 2023-04-06 ENCOUNTER — Ambulatory Visit (INDEPENDENT_AMBULATORY_CARE_PROVIDER_SITE_OTHER): Payer: Medicare Other

## 2023-04-06 DIAGNOSIS — E538 Deficiency of other specified B group vitamins: Secondary | ICD-10-CM | POA: Diagnosis not present

## 2023-04-06 MED ORDER — CYANOCOBALAMIN 1000 MCG/ML IJ SOLN
1000.0000 ug | Freq: Once | INTRAMUSCULAR | Status: AC
  Administered 2023-04-06: 1000 ug via INTRAMUSCULAR

## 2023-04-06 NOTE — Progress Notes (Signed)
Pt here for monthly B12 injection per Dr. Lawerance Bach   B12 given IM and pt tolerated injection well.  Patient advised to report to the office immediately if she notices any adverse reactions. Patient gave a verbal understanding.

## 2023-04-10 ENCOUNTER — Ambulatory Visit (INDEPENDENT_AMBULATORY_CARE_PROVIDER_SITE_OTHER): Payer: Medicare Other

## 2023-04-10 DIAGNOSIS — Z0000 Encounter for general adult medical examination without abnormal findings: Secondary | ICD-10-CM

## 2023-04-10 DIAGNOSIS — Z1231 Encounter for screening mammogram for malignant neoplasm of breast: Secondary | ICD-10-CM | POA: Diagnosis not present

## 2023-04-10 NOTE — Patient Instructions (Signed)
Ms. Burdell , Thank you for taking time to come for your Medicare Wellness Visit. I appreciate your ongoing commitment to your health goals. Please review the following plan we discussed and let me know if I can assist you in the future.   Referrals/Orders/Follow-Ups/Clinician Recommendations: mammogram  You have an order for:  []   2D Mammogram  [x]   3D Mammogram  []   Bone Density     Please call for appointment:  [facility] [address] [place], [place] [address] [phone]  S   Make sure to wear two-piece clothing.  No lotions, powders, or deodorants the day of the appointment. Make sure to bring picture ID and insurance card.  Bring list of medications you are currently taking including any supplements.   Schedule your [place] screening mammogram through MyChart!   Log into your MyChart account.  Go to 'Visit' (or 'Appointments' if on mobile App) --> Schedule an Appointment  Under 'Select a Reason for Visit' choose the Mammogram Screening option.  Complete the pre-visit questions and select the time and place that best fits your schedule.    This is a list of the screening recommended for you and due dates:  Health Maintenance  Topic Date Due   DTaP/Tdap/Td vaccine (1 - Tdap) Never done   Zoster (Shingles) Vaccine (1 of 2) Never done   Eye exam for diabetics  03/14/2022   COVID-19 Vaccine (4 - 2024-25 season) 11/09/2022   Yearly kidney health urinalysis for diabetes  04/01/2023   Hemoglobin A1C  04/12/2023   Mammogram  06/26/2023   Yearly kidney function blood test for diabetes  10/10/2023   Complete foot exam   10/10/2023   Medicare Annual Wellness Visit  04/09/2024   Colon Cancer Screening  10/06/2030   Pneumonia Vaccine  Completed   DEXA scan (bone density measurement)  Completed   Hepatitis C Screening  Completed   HPV Vaccine  Aged Out   Flu Shot  Discontinued    Advanced directives: (ACP Link)Information on Advanced Care  Planning can be found at [facility] [place] Advance Health Care Directives Advance Health Care Directives ([email or web address]   Next Medicare Annual Wellness Visit scheduled for next year: Yes  insert Preventive Care attachment Insert FALL PREVENTION attachment if needed

## 2023-04-10 NOTE — Progress Notes (Signed)
Subjective:   Paige Phillips is a 73 y.o. female who presents for Medicare Annual (Subsequent) preventive examination.  Visit Complete: Virtual I connected with  Paige Phillips on 04/10/23 by a video and audio enabled telemedicine application and verified that I am speaking with the correct person using two identifiers.  Patient Location: Home  Provider Location: Home Office  I discussed the limitations of evaluation and management by telemedicine. The patient expressed understanding and agreed to proceed.  Vital Signs: Because this visit was a virtual/telehealth visit, some criteria may be missing or patient reported. Any vitals not documented were not able to be obtained and vitals that have been documented are patient reported.  Patient Medicare AWV questionnaire was completed by the patient on 04/09/2023; I have confirmed that all information answered by patient is correct and no changes since this date.  Cardiac Risk Factors include: advanced age (>23men, >63 women);diabetes mellitus;dyslipidemia;hypertension     Objective:    Today's Vitals   There is no height or weight on file to calculate BMI.     04/10/2023   10:03 AM 04/18/2022    1:04 PM 04/17/2021   12:39 PM 11/05/2020    2:07 PM 04/16/2020    1:12 PM 11/24/2019    8:09 AM  Advanced Directives  Does Patient Have a Medical Advance Directive? No No No No No No  Would patient like information on creating a medical advance directive? No - Patient declined No - Patient declined No - Patient declined Yes (MAU/Ambulatory/Procedural Areas - Information given) No - Patient declined     Current Medications (verified) Outpatient Encounter Medications as of 04/10/2023  Medication Sig   acetaminophen (TYLENOL) 650 MG CR tablet Take 1,300 mg by mouth every 8 (eight) hours as needed for pain.   Alpha-Lipoic Acid 600 MG CAPS Take 600 mg by mouth 2 (two) times daily.   amLODipine (NORVASC) 5 MG tablet TAKE 1 TABLET(5 MG) BY  MOUTH DAILY   carvedilol (COREG) 6.25 MG tablet Take 1 tablet (6.25 mg total) by mouth 2 (two) times daily with a meal. D/c bystolic due to cost, d/c 3.125   Cholecalciferol (HM VITAMIN D3) 100 MCG (4000 UT) CAPS Take 4,000 Units by mouth daily.   metFORMIN (GLUCOPHAGE) 500 MG tablet Take 1 tablet (500 mg total) by mouth daily with breakfast.   pravastatin (PRAVACHOL) 20 MG tablet Take 1 tablet (20 mg total) by mouth at bedtime.   Semaglutide (RYBELSUS) 3 MG TABS Take 1 tablet (3 mg total) by mouth daily. (Patient not taking: Reported on 04/10/2023)   terbinafine (LAMISIL AT) 1 % cream Apply 1 application. topically 2 (two) times daily. B/l feet (Patient not taking: Reported on 04/10/2023)   No facility-administered encounter medications on file as of 04/10/2023.    Allergies (verified) Losartan   History: Past Medical History:  Diagnosis Date   Cataract    OS>OD 03/14/21 [facility]   Chickenpox    DDD (degenerative disc disease), lumbar    Diabetes mellitus without complication (HCC)    GERD (gastroesophageal reflux disease)    Hypertension    Lumbar herniated disc    Spinal stenosis    Past Surgical History:  Procedure Laterality Date   ABDOMINAL HYSTERECTOMY  1983   fibroids    CARPAL TUNNEL RELEASE     rt   Family History  Problem Relation Age of Onset   Cancer Father        pancreatic cancer    Cancer Sister  breast cancer age 65s   Diabetes Sister    Cancer Brother        pancreatic cancer   Stroke Brother        died stroke   ALS Mother        24s   Social History   Socioeconomic History   Marital status: Single    Spouse name: Not on file   Number of children: Not on file   Years of education: Not on file   Highest education level: 12th grade  Occupational History   Not on file  Tobacco Use   Smoking status: Never   Smokeless tobacco: Never  Vaping Use   Vaping status: Never Used  Substance and Sexual Activity   Alcohol use: Not Currently     Comment: social   Drug use: Never   Sexual activity: Not Currently  Other Topics Concern   Not on file  Social History Narrative   [employer] worker part time    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    2 kids son and daughter French Ana also my patient    No guns    Wears seat belt    Safe in relationship    Never smoker    Social Drivers of Corporate investment banker Strain: Low Risk  (04/09/2023)   Overall Financial Resource Strain (CARDIA)    Difficulty of Paying Living Expenses: Not hard at all  Food Insecurity: No Food Insecurity (04/09/2023)   Hunger Vital Sign    Worried About Running Out of Food in the Last Year: Never true    Ran Out of Food in the Last Year: Never true  Transportation Needs: No Transportation Needs (04/09/2023)   PRAPARE - Administrator, Civil Service (Medical): No    Lack of Transportation (Non-Medical): No  Physical Activity: Inactive (04/09/2023)   Exercise Vital Sign    Days of Exercise per Week: 0 days    Minutes of Exercise per Session: 0 min  Stress: No Stress Concern Present (04/09/2023)   [employer] of Occupational Health - Occupational Stress Questionnaire    Feeling of Stress : Not at all  Social Connections: Moderately Isolated (04/09/2023)   Social Connection and Isolation Panel [NHANES]    Frequency of Communication with Friends and Family: More than three times a week    Frequency of Social Gatherings with Friends and Family: More than three times a week    Attends Religious Services: More than 4 times per year    Active Member of [employer] or Organizations: No    Attends Engineer, structural: Never    Marital Status: Divorced    Tobacco Counseling Counseling given: Not Answered   Clinical Intake:  Pre-visit preparation completed: Yes  Pain : No/denies pain     Nutritional Risks: None Diabetes: Yes CBG done?: No Did pt. bring in CBG monitor from home?: No  How often do you need to have someone help you when you read  instructions, pamphlets, or other written materials from your doctor or pharmacy?: 1 - Never  Interpreter Needed?: No  Information entered by :: NAllen LPN   Activities of Daily Living    04/09/2023   11:30 AM 04/18/2022    1:07 PM  In your present state of health, do you have any difficulty performing the following activities:  Hearing? 0 0  Vision? 0 0  Difficulty concentrating or making decisions? 0 0  Walking or climbing stairs? 0 0  Dressing  or bathing? 0 0  Doing errands, shopping? 0 0  Preparing Food and eating ? N N  Using the Toilet? N N  In the past six months, have you accidently leaked urine? N N  Do you have problems with loss of bowel control? N N  Managing your Medications? N N  Managing your Finances? N N  Housekeeping or managing your Housekeeping? N N    Patient Care Team: Pincus Sanes, MD as PCP - General (Internal Medicine)  Indicate any recent Medical Services you may have received from other than [facility] providers in the past year (date may be approximate).     Assessment:   This is a routine wellness examination for Kenda.  Hearing/Vision screen Hearing Screening - Comments:: Denies hearing issues Vision Screening - Comments:: Regular eye exams, [facility]   Goals Addressed             This Visit's Progress    Patient Stated       04/10/2023, start exercising       Depression Screen    04/10/2023   10:05 AM 04/29/2022   10:09 AM 04/28/2022    8:20 AM 03/31/2022    9:34 AM 09/19/2021    9:20 AM 04/17/2021   12:38 PM 11/05/2020    2:07 PM  PHQ 2/9 Scores  PHQ - 2 Score 0 0 0 0 0 0 0  PHQ- 9 Score 0 0 0 0       Fall Risk    04/09/2023   11:30 AM 04/18/2022    1:05 PM 03/31/2022    9:33 AM 09/19/2021    9:19 AM 04/17/2021   12:56 PM  Fall Risk   Falls in the past year? 0 0 0 0 0  Number falls in past yr: 0 0 0 0 0  Injury with Fall? 0 0 0 0   Risk for fall due to : Medication side effect No Fall Risks No Fall Risks No Fall Risks    Follow up Falls evaluation completed;Falls prevention discussed Falls prevention discussed Falls evaluation completed Falls evaluation completed Falls evaluation completed    MEDICARE RISK AT HOME: Medicare Risk at Home Any stairs in or around the home?: (Patient-Rptd) Yes If so, are there any without handrails?: (Patient-Rptd) No Home free of loose throw rugs in walkways, pet beds, electrical cords, etc?: (Patient-Rptd) Yes Adequate lighting in your home to reduce risk of falls?: (Patient-Rptd) Yes Life alert?: (Patient-Rptd) No Use of a cane, walker or w/c?: (Patient-Rptd) No Grab bars in the bathroom?: (Patient-Rptd) No Shower chair or bench in shower?: (Patient-Rptd) No Elevated toilet seat or a handicapped toilet?: (Patient-Rptd) No  TIMED UP AND GO:  Was the test performed?  No    Cognitive Function:        04/10/2023   10:05 AM 04/18/2022    1:14 PM  6CIT Screen  What Year? 0 points 0 points  What month? 0 points 0 points  What time? 0 points 0 points  Count back from 20 0 points 0 points  Months in reverse 0 points 0 points  Repeat phrase 0 points 0 points  Total Score 0 points 0 points    Immunizations Immunization History  Administered Date(s) Administered   Fluad Quad(high Dose 65+) 02/22/2020, 05/15/2021   Moderna Sars-Covid-2 Vaccination 04/23/2019, 05/12/2019, 02/22/2020   PNEUMOCOCCAL CONJUGATE-20 09/19/2021    TDAP status: Due, Education has been provided regarding the importance of this vaccine. Advised may receive this vaccine  at local pharmacy or Health Dept. Aware to provide a copy of the vaccination record if obtained from local pharmacy or Health Dept. Verbalized acceptance and understanding.  Flu Vaccine status: Due, Education has been provided regarding the importance of this vaccine. Advised may receive this vaccine at local pharmacy or Health Dept. Aware to provide a copy of the vaccination record if obtained from local pharmacy or Health Dept.  Verbalized acceptance and understanding.  Pneumococcal vaccine status: Up to date  Covid-19 vaccine status: Information provided on how to obtain vaccines.   Qualifies for Shingles Vaccine? Yes   Zostavax completed No   Shingrix Completed?: No.    Education has been provided regarding the importance of this vaccine. Patient has been advised to call insurance company to determine out of pocket expense if they have not yet received this vaccine. Advised may also receive vaccine at local pharmacy or Health Dept. Verbalized acceptance and understanding.  Screening Tests Health Maintenance  Topic Date Due   DTaP/Tdap/Td (1 - Tdap) Never done   Zoster Vaccines- Shingrix (1 of 2) Never done   OPHTHALMOLOGY EXAM  03/14/2022   COVID-19 Vaccine (4 - 2024-25 season) 11/09/2022   Diabetic kidney evaluation - Urine ACR  04/01/2023   HEMOGLOBIN A1C  04/12/2023   MAMMOGRAM  06/26/2023   Diabetic kidney evaluation - eGFR measurement  10/10/2023   FOOT EXAM  10/10/2023   Medicare Annual Wellness (AWV)  04/09/2024   Colonoscopy  10/06/2030   Pneumonia Vaccine 83+ Years old  Completed   DEXA SCAN  Completed   Hepatitis C Screening  Completed   HPV VACCINES  Aged Out   INFLUENZA VACCINE  Discontinued    Health Maintenance  Health Maintenance Due  Topic Date Due   DTaP/Tdap/Td (1 - Tdap) Never done   Zoster Vaccines- Shingrix (1 of 2) Never done   OPHTHALMOLOGY EXAM  03/14/2022   COVID-19 Vaccine (4 - 2024-25 season) 11/09/2022   Diabetic kidney evaluation - Urine ACR  04/01/2023    Colorectal cancer screening: Type of screening: Colonoscopy. Completed 10/05/2020. Repeat every 10 years  Mammogram status: Ordered today. Pt provided with contact info and advised to call to schedule appt.   Bone Density status: Completed 02/03/2018.   Lung Cancer Screening: (Low Dose CT Chest recommended if Age 52-80 years, 20 pack-year currently smoking OR have quit w/in 15years.) does not qualify.   Lung  Cancer Screening Referral: no  Additional Screening:  Hepatitis C Screening: does qualify; Completed 12/25/2017  Vision Screening: Recommended annual ophthalmology exams for early detection of glaucoma and other disorders of the eye. Is the patient up to date with their annual eye exam?  Yes  Who is the provider or what is the name of the office in which the patient attends annual eye exams? [facility] If pt is not established with a provider, would they like to be referred to a provider to establish care? No .   Dental Screening: Recommended annual dental exams for proper oral hygiene  Diabetic Foot Exam: Diabetic Foot Exam: Completed 10/10/2022  Community Resource Referral / Chronic Care Management: CRR required this visit?  No   CCM required this visit?  No     Plan:     I have personally reviewed and noted the following in the patient's chart:   Medical and social history Use of alcohol, tobacco or illicit drugs  Current medications and supplements including opioid prescriptions. Patient is not currently taking opioid prescriptions. Functional ability and  status Nutritional status Physical activity Advanced directives List of other physicians Hospitalizations, surgeries, and ER visits in previous 12 months Vitals Screenings to include cognitive, depression, and falls Referrals and appointments  In addition, I have reviewed and discussed with patient certain preventive protocols, quality metrics, and best practice recommendations. A written personalized care plan for preventive services as well as general preventive health recommendations were provided to patient.     Barb Merino, LPN   10/18/9145   After Visit Summary: (MyChart) Due to this being a telephonic visit, the after visit summary with patients personalized plan was offered to patient via MyChart   Nurse Notes: none

## 2023-04-12 ENCOUNTER — Encounter: Payer: Self-pay | Admitting: Internal Medicine

## 2023-04-12 NOTE — Patient Instructions (Addendum)
      Blood work was ordered.       Medications changes include :   ozempic 0.25 mg weekly     Return in about 3 months (around 07/11/2023) for follow up.

## 2023-04-12 NOTE — Progress Notes (Unsigned)
Subjective:    Patient ID: Paige Phillips, female    DOB: 10/21/1950, 73 y.o.   MRN: [id number]     HPI Paige Phillips is here for follow up of her chronic medical problems.  Not taking rybelsus - too expensive  Has not taken medications in about one month.  Her daughter and granddaughter were in a bad mva and that threw her off and she stopped taking all of her medication.  It was of course very upsetting and stressful.   Medications and allergies reviewed with patient and updated if appropriate.  Current Outpatient Medications on File Prior to Visit  Medication Sig Dispense Refill   acetaminophen (TYLENOL) 650 MG CR tablet Take 1,300 mg by mouth every 8 (eight) hours as needed for pain.     Alpha-Lipoic Acid 600 MG CAPS Take 600 mg by mouth 2 (two) times daily.     amLODipine (NORVASC) 5 MG tablet TAKE 1 TABLET(5 MG) BY MOUTH DAILY 90 tablet 3   carvedilol (COREG) 6.25 MG tablet Take 1 tablet (6.25 mg total) by mouth 2 (two) times daily with a meal. D/c bystolic due to cost, d/c 3.125 180 tablet 3   Cholecalciferol (HM VITAMIN D3) 100 MCG (4000 UT) CAPS Take 4,000 Units by mouth daily.     metFORMIN (GLUCOPHAGE) 500 MG tablet Take 1 tablet (500 mg total) by mouth daily with breakfast. 90 tablet 3   pravastatin (PRAVACHOL) 20 MG tablet Take 1 tablet (20 mg total) by mouth at bedtime. 90 tablet 3   No current facility-administered medications on file prior to visit.     Review of Systems  Constitutional:  Negative for fever.  Respiratory:  Negative for cough, shortness of breath and wheezing.   Cardiovascular:  Negative for chest pain, palpitations and leg swelling.  Gastrointestinal:  Negative for constipation.       No gerd  Musculoskeletal:  Positive for arthralgias (right knee pain).  Neurological:  Negative for light-headedness and headaches.       Objective:   Vitals:   04/13/23 0903  BP: (!) 140/76  Pulse: 79  Resp: 18  Temp: 97.9 F (36.6 C)   BP  Readings from Last 3 Encounters:  04/13/23 (!) 140/76  10/10/22 136/88  03/31/22 138/70   Wt Readings from Last 3 Encounters:  04/13/23 252 lb (114.3 kg)  10/10/22 244 lb (110.7 kg)  04/18/22 228 lb (103.4 kg)   Body mass index is 43.26 kg/m.    Physical Exam Constitutional:      General: She is not in acute distress.    Appearance: Normal appearance.  HENT:     Head: Normocephalic and atraumatic.  Eyes:     Conjunctiva/sclera: Conjunctivae normal.  Cardiovascular:     Rate and Rhythm: Normal rate and regular rhythm.     Heart sounds: Normal heart sounds.  Pulmonary:     Effort: Pulmonary effort is normal. No respiratory distress.     Breath sounds: Normal breath sounds. No wheezing.  Musculoskeletal:     Cervical back: Neck supple.     Right lower leg: No edema.     Left lower leg: No edema.  Lymphadenopathy:     Cervical: No cervical adenopathy.  Skin:    General: Skin is warm and dry.     Findings: No rash.  Neurological:     Mental Status: She is alert. Mental status is at baseline.  Psychiatric:        Mood and  Affect: Mood normal.        Behavior: Behavior normal.        Lab Results  Component Value Date   WBC 10.1 10/10/2022   HGB 13.9 10/10/2022   HCT 44.3 10/10/2022   PLT 315.0 10/10/2022   GLUCOSE 136 (H) 10/10/2022   CHOL 182 10/10/2022   TRIG 100.0 10/10/2022   HDL 51.90 10/10/2022   LDLCALC 110 (H) 10/10/2022   ALT 16 10/10/2022   AST 17 10/10/2022   NA 138 10/10/2022   K 4.5 10/10/2022   CL 102 10/10/2022   CREATININE 1.02 10/10/2022   BUN 13 10/10/2022   CO2 26 10/10/2022   TSH 0.84 05/15/2021   HGBA1C 6.8 (H) 10/10/2022   MICROALBUR 3.6 (H) 03/31/2022     Assessment & Plan:    See Problem List for Assessment and Plan of chronic medical problems.

## 2023-04-13 ENCOUNTER — Ambulatory Visit (INDEPENDENT_AMBULATORY_CARE_PROVIDER_SITE_OTHER): Payer: Medicare Other | Admitting: Internal Medicine

## 2023-04-13 VITALS — BP 128/80 | HR 79 | Temp 97.9°F | Resp 18 | Ht 64.0 in | Wt 252.0 lb

## 2023-04-13 DIAGNOSIS — E785 Hyperlipidemia, unspecified: Secondary | ICD-10-CM

## 2023-04-13 DIAGNOSIS — E1159 Type 2 diabetes mellitus with other circulatory complications: Secondary | ICD-10-CM | POA: Diagnosis not present

## 2023-04-13 DIAGNOSIS — I152 Hypertension secondary to endocrine disorders: Secondary | ICD-10-CM

## 2023-04-13 DIAGNOSIS — E1169 Type 2 diabetes mellitus with other specified complication: Secondary | ICD-10-CM

## 2023-04-13 DIAGNOSIS — E538 Deficiency of other specified B group vitamins: Secondary | ICD-10-CM | POA: Diagnosis not present

## 2023-04-13 DIAGNOSIS — E559 Vitamin D deficiency, unspecified: Secondary | ICD-10-CM

## 2023-04-13 DIAGNOSIS — Z7984 Long term (current) use of oral hypoglycemic drugs: Secondary | ICD-10-CM

## 2023-04-13 LAB — MICROALBUMIN / CREATININE URINE RATIO
Creatinine,U: 189.4 mg/dL
Microalb Creat Ratio: 4.2 mg/g (ref 0.0–30.0)
Microalb, Ur: 7.9 mg/dL — ABNORMAL HIGH (ref 0.0–1.9)

## 2023-04-13 LAB — LIPID PANEL
Cholesterol: 139 mg/dL (ref 0–200)
HDL: 47.2 mg/dL (ref >39.00)
LDL Cholesterol: 75 mg/dL (ref 0–99)
NonHDL: 91.73
Total CHOL/HDL Ratio: 3
Triglycerides: 84 mg/dL (ref 0.0–149.0)
VLDL: 16.8 mg/dL (ref 0.0–40.0)

## 2023-04-13 LAB — COMPREHENSIVE METABOLIC PANEL
ALT: 9 U/L (ref 0–35)
AST: 13 U/L (ref 0–37)
Albumin: 3.9 g/dL (ref 3.5–5.2)
Alkaline Phosphatase: 70 U/L (ref 39–117)
BUN: 9 mg/dL (ref 6–23)
CO2: 24 meq/L (ref 19–32)
Calcium: 8.7 mg/dL (ref 8.4–10.5)
Chloride: 108 meq/L (ref 96–112)
Creatinine, Ser: 0.77 mg/dL (ref 0.40–1.20)
GFR: 77.31 mL/min (ref >60.00)
Glucose, Bld: 119 mg/dL — ABNORMAL HIGH (ref 70–99)
Potassium: 3.3 meq/L — ABNORMAL LOW (ref 3.5–5.1)
Sodium: 141 meq/L (ref 135–145)
Total Bilirubin: 0.8 mg/dL (ref 0.2–1.2)
Total Protein: 7.3 g/dL (ref 6.0–8.3)

## 2023-04-13 LAB — HEMOGLOBIN A1C: Hgb A1c MFr Bld: 6.6 % — ABNORMAL HIGH (ref 4.6–6.5)

## 2023-04-13 LAB — VITAMIN D 25 HYDROXY (VIT D DEFICIENCY, FRACTURES): VITD: 33.83 ng/mL (ref 30.00–100.00)

## 2023-04-13 MED ORDER — OZEMPIC (0.25 OR 0.5 MG/DOSE) 2 MG/3ML ~~LOC~~ SOPN: 0.2500 mg | PEN_INJECTOR | SUBCUTANEOUS | 0 refills | Status: DC

## 2023-04-13 NOTE — Assessment & Plan Note (Signed)
Chronic ?Regular exercise and healthy diet encouraged ?Check lipid panel  ?Continue pravastatin 20 mg daily ?

## 2023-04-13 NOTE — Assessment & Plan Note (Addendum)
Chronic Blood pressure well controlled CMP, cbc Restart amlodipine 5 mg daily, Coreg 6.25 mg twice daily Monitor BP at home

## 2023-04-13 NOTE — Assessment & Plan Note (Addendum)
Chronic With hyperlipidemia, obesity  Lab Results  Component Value Date   HGBA1C 6.8 (H) 10/10/2022   Sugars controlled Check A1c and urine albumin/cr  Stressed regular exercise ( limited by back pain), diabetic diet Continue metformin to 500 mg daily Start Ozempic 0.2 mg weekly-weight loss is important for her for multiple reasons and I think this medication will help control her sugars in addition to helping with weight loss.  Discussed possible side effects

## 2023-04-13 NOTE — Assessment & Plan Note (Signed)
Chronic Doing B12 injections monthly-continue

## 2023-04-13 NOTE — Assessment & Plan Note (Addendum)
Chronic BMI 43.26 kg/m with DM, htn Stressed weight loss-she knows she needs to lose weight and wants to lose weight Try to be as active as possible Healthy diet, decreased portions-discussed the importance of increasing protein, fiber and vegetables and eating low sugars and white carbohydrates Will start Ozempic to help with sugar control and to aid in weight loss Discussed that only lifestyle changes work long-term and she needs to work on her lifestyle changes in addition to taking her medication

## 2023-04-13 NOTE — Assessment & Plan Note (Signed)
 Chronic Taking vitamin D daily Check vitamin D level

## 2023-04-14 ENCOUNTER — Encounter: Payer: Self-pay | Admitting: Internal Medicine

## 2023-05-07 ENCOUNTER — Ambulatory Visit (INDEPENDENT_AMBULATORY_CARE_PROVIDER_SITE_OTHER): Payer: Medicare Other

## 2023-05-07 DIAGNOSIS — E538 Deficiency of other specified B group vitamins: Secondary | ICD-10-CM | POA: Diagnosis not present

## 2023-05-07 MED ORDER — CYANOCOBALAMIN 1000 MCG/ML IJ SOLN
1000.0000 ug | Freq: Once | INTRAMUSCULAR | Status: AC
  Administered 2023-05-07: 1000 ug via INTRAMUSCULAR

## 2023-05-07 NOTE — Progress Notes (Signed)
 Patient visits today to receive her B12 injection/vaccine. Patient was informed and tolerated well. Patient was notified to reach out to [place] if needed.

## 2023-05-22 ENCOUNTER — Ambulatory Visit
Admission: RE | Admit: 2023-05-22 | Discharge: 2023-05-22 | Disposition: A | Discharge status: Home / Self Care | Payer: Medicare Other | Source: Ambulatory Visit | Attending: Internal Medicine | Admitting: Internal Medicine

## 2023-05-22 DIAGNOSIS — Z1231 Encounter for screening mammogram for malignant neoplasm of breast: Secondary | ICD-10-CM

## 2023-06-04 ENCOUNTER — Ambulatory Visit: Payer: Medicare Other

## 2023-06-08 ENCOUNTER — Ambulatory Visit (INDEPENDENT_AMBULATORY_CARE_PROVIDER_SITE_OTHER)

## 2023-06-08 DIAGNOSIS — E538 Deficiency of other specified B group vitamins: Secondary | ICD-10-CM

## 2023-06-08 MED ORDER — CYANOCOBALAMIN 1000 MCG/ML IJ SOLN
1000.0000 ug | Freq: Once | INTRAMUSCULAR | Status: AC
  Administered 2023-06-08: 1000 ug via INTRAMUSCULAR

## 2023-06-08 NOTE — Progress Notes (Signed)
 Per orders of Dr. Lawerance Bach, injection of Cyanocobalamin given by Johnella Moloney. Patient tolerated injection well.

## 2023-07-08 ENCOUNTER — Ambulatory Visit

## 2023-07-08 DIAGNOSIS — E538 Deficiency of other specified B group vitamins: Secondary | ICD-10-CM

## 2023-07-08 MED ORDER — CYANOCOBALAMIN 1000 MCG/ML IJ SOLN
1000.0000 ug | Freq: Once | INTRAMUSCULAR | Status: AC
Start: 2023-07-08 — End: ?

## 2023-07-08 NOTE — Progress Notes (Signed)
Pt was given B12 injection w/o any complications. 

## 2023-07-12 IMAGING — MG MM DIGITAL DIAGNOSTIC UNILAT*L* W/ TOMO W/ CAD
6 series · 6 of 18 positions shown · non-contrast
Comparison: Previous exam(s).

CLINICAL DATA: The patient was called back for a left breast
asymmetry.

EXAM:
DIGITAL DIAGNOSTIC UNILATERAL LEFT MAMMOGRAM WITH TOMOSYNTHESIS AND
CAD
TECHNIQUE: Left digital diagnostic mammography and breast tomosynthesis was
performed. The images were evaluated with computer-aided detection.

[L MLO synth-2D (1 of 2)]
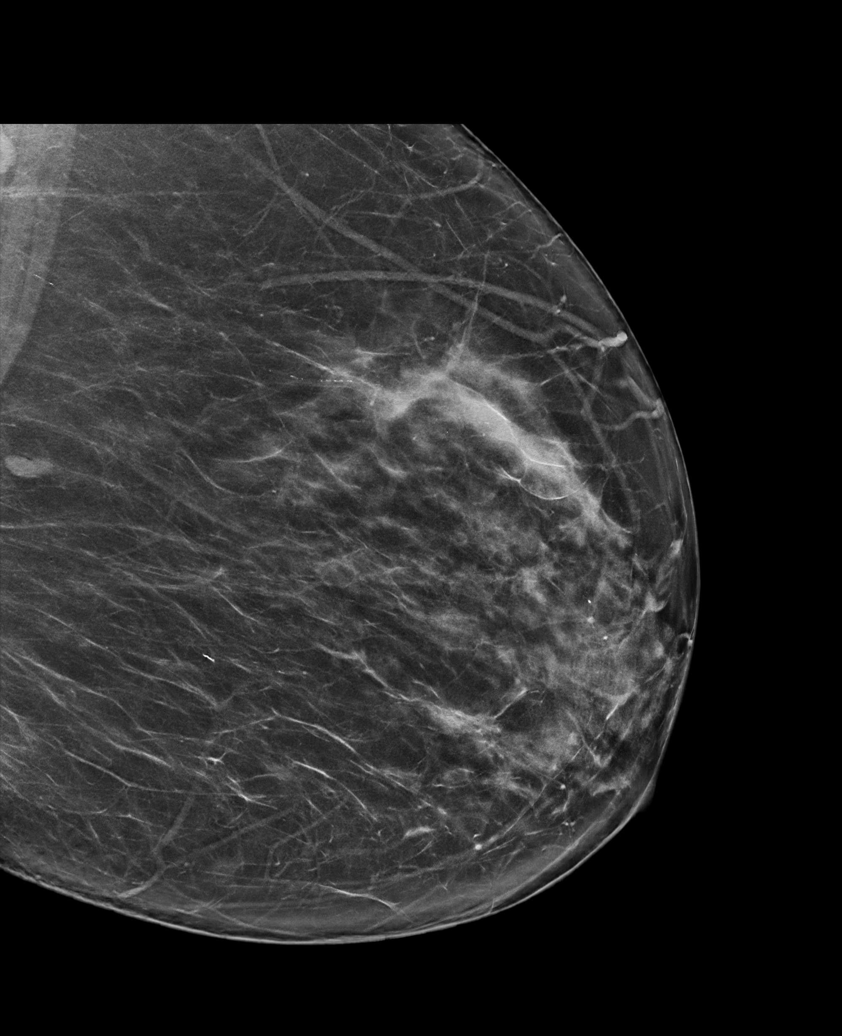

[L ML synth-2D]
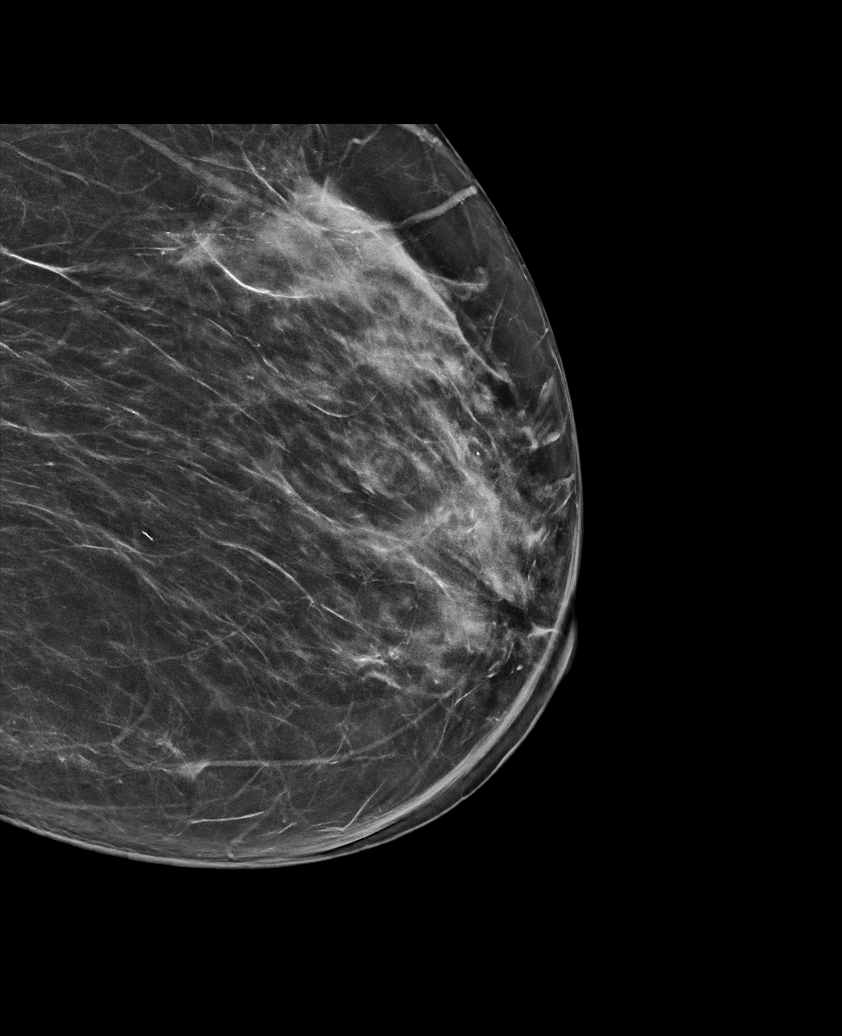

[L MLO synth-2D (2 of 2)]
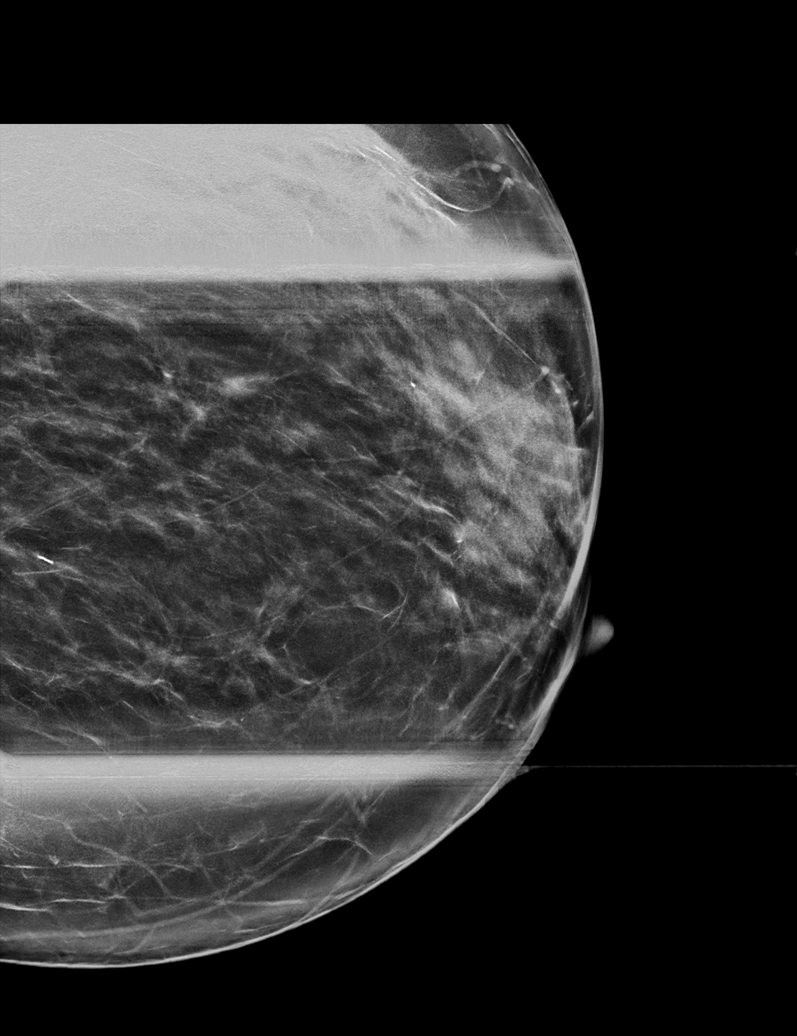

[L ML tomo · tomo slice 42/83.0]
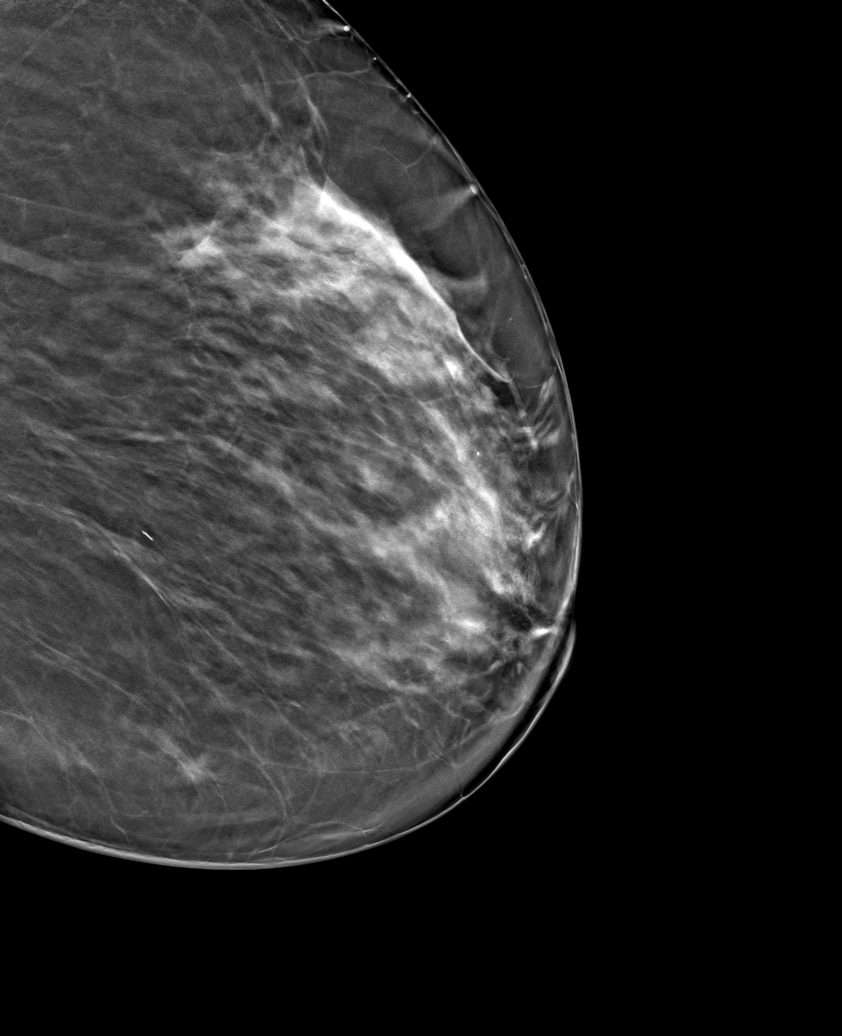

[L MLO tomo (1 of 2) · tomo slice 34/67.0]
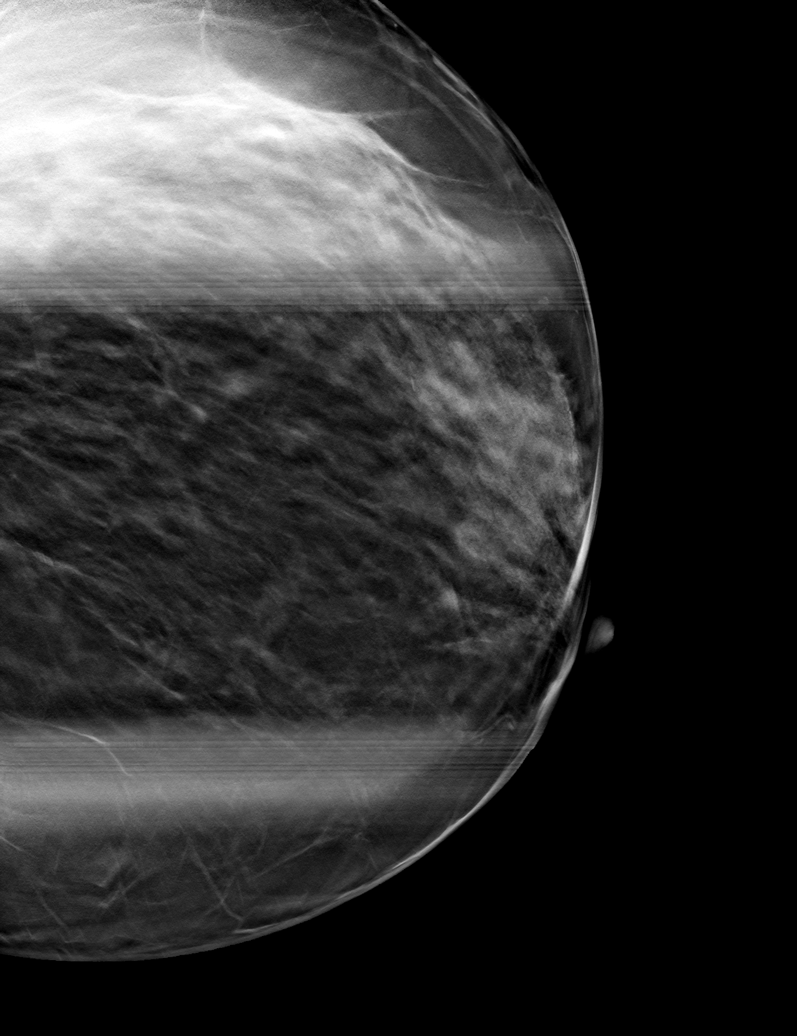

[L MLO tomo (2 of 2) · tomo slice 45/90.0]
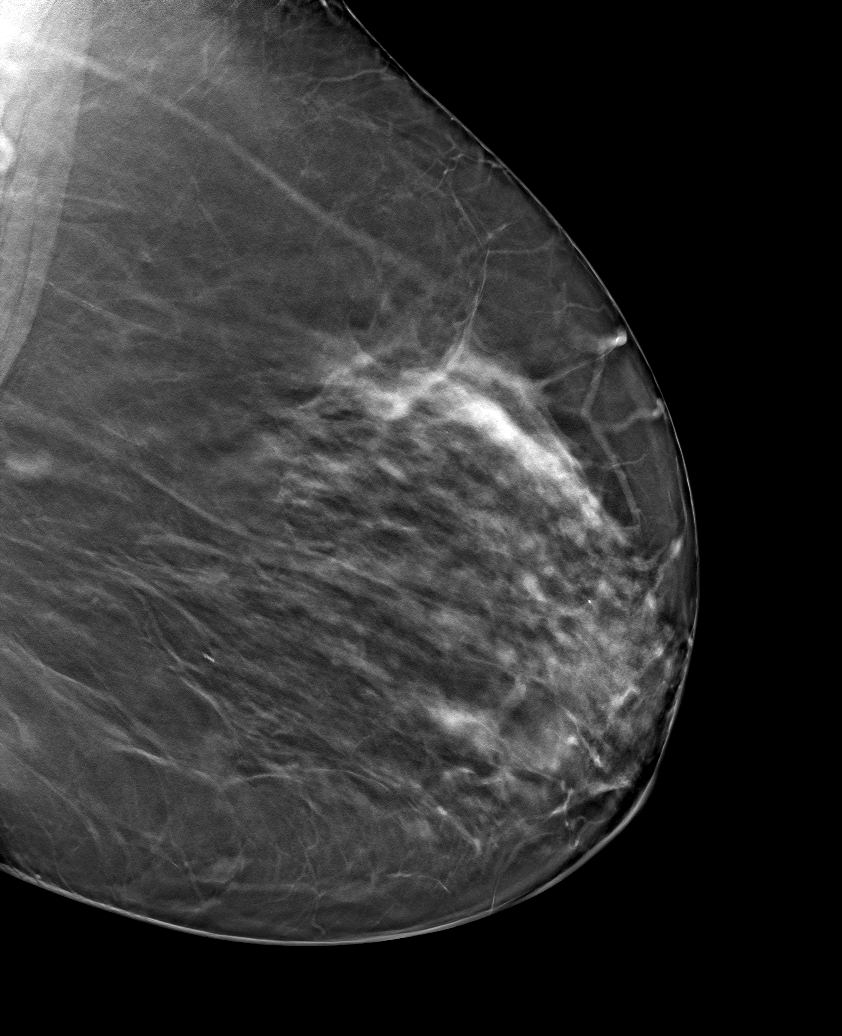

[6 of 18 positions shown; findings below may reference images not displayed]

ACR Breast Density Category b: There are scattered areas of
fibroglandular density.
FINDINGS: A left breast asymmetry resolves on today's imaging.
IMPRESSION: No mammographic evidence of malignancy.

RECOMMENDATION:
Annual screening mammography.

I have discussed the findings and recommendations with the patient.
If applicable, a reminder letter will be sent to the patient
regarding the next appointment.

BI-RADS CATEGORY  1: Negative.

## 2023-07-12 NOTE — Progress Notes (Unsigned)
 Subjective:    Patient ID: Paige Phillips, female    DOB: 07-06-1950, 73 y.o.   MRN: [id number]     HPI Paige Phillips is here for follow up of her chronic medical problems.  Started ozempic  2 months ago. Took it 6 weeks.  Then stopped it.  It was too expensive and she could not afford to continue taking it.  She does feel like it helped get her back into good routine and she has been working on lifestyle changes since then  She has cut back on how much is eating, eating less bread.  She has lost a good amount of weight since she was here last.  Having increased burping, occ gerd.  Taking prilosec otc prn.   Medications and allergies reviewed with patient and updated if appropriate.  Current Outpatient Medications on File Prior to Visit  Medication Sig Dispense Refill   acetaminophen  (TYLENOL ) 650 MG CR tablet Take 1,300 mg by mouth every 8 (eight) hours as needed for pain.     Alpha-Lipoic Acid 600 MG CAPS Take 600 mg by mouth 2 (two) times daily.     amLODipine  (NORVASC ) 5 MG tablet TAKE 1 TABLET(5 MG) BY MOUTH DAILY 90 tablet 3   carvedilol  (COREG ) 6.25 MG tablet Take 1 tablet (6.25 mg total) by mouth 2 (two) times daily with a meal. D/c bystolic  due to cost, d/c 3.125 180 tablet 3   Cholecalciferol  (HM VITAMIN D3) 100 MCG (4000 UT) CAPS Take 4,000 Units by mouth daily.     metFORMIN  (GLUCOPHAGE ) 500 MG tablet Take 1 tablet (500 mg total) by mouth daily with breakfast. 90 tablet 3   pravastatin  (PRAVACHOL ) 20 MG tablet Take 1 tablet (20 mg total) by mouth at bedtime. 90 tablet 3   Semaglutide ,0.25 or 0.5MG /DOS, (OZEMPIC , 0.25 OR 0.5 MG/DOSE,) 2 MG/3ML SOPN Inject 0.25 mg into the skin once a week. Dx E11.9 3 mL 0   Current Facility-Administered Medications on File Prior to Visit  Medication Dose Route Frequency Provider Last Rate Last Admin   cyanocobalamin  (VITAMIN B12) injection 1,000 mcg  1,000 mcg Intramuscular Once Colene Dauphin, MD         Review of Systems   Constitutional:  Negative for fever.  Respiratory:  Positive for cough (occ with gerd). Negative for shortness of breath and wheezing.   Cardiovascular:  Negative for chest pain, palpitations and leg swelling.  Gastrointestinal:        Burping and occ GERD  Neurological:  Negative for light-headedness and headaches.       Objective:   Vitals:   07/14/23 0933  BP: 134/70  Pulse: 70  Resp: 18  Temp: 97.9 F (36.6 C)   BP Readings from Last 3 Encounters:  07/14/23 134/70  04/13/23 128/80  10/10/22 136/88   Wt Readings from Last 3 Encounters:  07/14/23 226 lb (102.5 kg)  04/13/23 252 lb (114.3 kg)  10/10/22 244 lb (110.7 kg)   Body mass index is 38.79 kg/m.    Physical Exam Constitutional:      General: She is not in acute distress.    Appearance: Normal appearance.  HENT:     Head: Normocephalic and atraumatic.  Eyes:     Conjunctiva/sclera: Conjunctivae normal.  Cardiovascular:     Rate and Rhythm: Normal rate and regular rhythm.     Heart sounds: Normal heart sounds.  Pulmonary:     Effort: Pulmonary effort is normal. No respiratory distress.  Breath sounds: Normal breath sounds. No wheezing.  Musculoskeletal:     Cervical back: Neck supple.     Right lower leg: No edema.     Left lower leg: No edema.  Lymphadenopathy:     Cervical: No cervical adenopathy.  Skin:    General: Skin is warm and dry.     Findings: No rash.  Neurological:     Mental Status: She is alert. Mental status is at baseline.  Psychiatric:        Mood and Affect: Mood normal.        Behavior: Behavior normal.        Lab Results  Component Value Date   WBC 10.1 10/10/2022   HGB 13.9 10/10/2022   HCT 44.3 10/10/2022   PLT 315.0 10/10/2022   GLUCOSE 119 (H) 04/13/2023   CHOL 139 04/13/2023   TRIG 84.0 04/13/2023   HDL 47.20 04/13/2023   LDLCALC 75 04/13/2023   ALT 9 04/13/2023   AST 13 04/13/2023   NA 141 04/13/2023   K 3.3 (L) 04/13/2023   CL 108 04/13/2023    CREATININE 0.77 04/13/2023   BUN 9 04/13/2023   CO2 24 04/13/2023   TSH 0.84 05/15/2021   HGBA1C 6.6 (H) 04/13/2023   MICROALBUR 7.9 (H) 04/13/2023     Assessment & Plan:    See Problem List for Assessment and Plan of chronic medical problems.

## 2023-07-12 NOTE — Patient Instructions (Addendum)
      Blood work was ordered.       Medications changes include :   None    A referral was ordered and someone will call you to schedule an appointment.     Return in about 3 months (around 10/14/2023) for Physical Exam.

## 2023-07-14 ENCOUNTER — Encounter: Payer: Self-pay | Admitting: Internal Medicine

## 2023-07-14 ENCOUNTER — Ambulatory Visit (INDEPENDENT_AMBULATORY_CARE_PROVIDER_SITE_OTHER): Payer: Medicare Other | Admitting: Internal Medicine

## 2023-07-14 VITALS — BP 134/70 | HR 70 | Temp 97.9°F | Resp 18 | Ht 64.0 in | Wt 226.0 lb

## 2023-07-14 DIAGNOSIS — Z7984 Long term (current) use of oral hypoglycemic drugs: Secondary | ICD-10-CM

## 2023-07-14 DIAGNOSIS — E538 Deficiency of other specified B group vitamins: Secondary | ICD-10-CM | POA: Diagnosis not present

## 2023-07-14 DIAGNOSIS — E1169 Type 2 diabetes mellitus with other specified complication: Secondary | ICD-10-CM | POA: Diagnosis not present

## 2023-07-14 DIAGNOSIS — I152 Hypertension secondary to endocrine disorders: Secondary | ICD-10-CM | POA: Diagnosis not present

## 2023-07-14 DIAGNOSIS — Z7985 Long-term (current) use of injectable non-insulin antidiabetic drugs: Secondary | ICD-10-CM

## 2023-07-14 DIAGNOSIS — E1159 Type 2 diabetes mellitus with other circulatory complications: Secondary | ICD-10-CM | POA: Diagnosis not present

## 2023-07-14 DIAGNOSIS — E785 Hyperlipidemia, unspecified: Secondary | ICD-10-CM

## 2023-07-14 MED ORDER — OMEPRAZOLE 20 MG PO CPDR: 20.0000 mg | DELAYED_RELEASE_CAPSULE | Freq: Every day | ORAL | 5 refills | Status: DC | PRN

## 2023-07-14 NOTE — Assessment & Plan Note (Signed)
 Chronic Blood pressure well controlled Restart amlodipine  5 mg daily, Coreg  6.25 mg twice daily Monitor BP at home

## 2023-07-14 NOTE — Assessment & Plan Note (Signed)
 Chronic Doing B12 injections monthly-continue Last B12 injection 4/30

## 2023-07-14 NOTE — Assessment & Plan Note (Signed)
 Chronic Regular exercise and healthy diet encouraged Continue pravastatin  20 mg daily  Lab Results  Component Value Date   LDLCALC 75 04/13/2023

## 2023-07-14 NOTE — Assessment & Plan Note (Addendum)
 Chronic Weight 252 pounds on 04/13/2023 - currently 226 lb Started Ozempic  at that time and 0.25 mg weekly - took x 6 weeks Limited with exercise secondary to chronic back pain-encouraged to be as low as possible Eating less and eating better Has lost 26 lbs since last visit

## 2023-07-14 NOTE — Assessment & Plan Note (Addendum)
 Chronic With hyperlipidemia, obesity  Lab Results  Component Value Date   HGBA1C 6.6 (H) 04/13/2023   Sugars controlled-improved Stressed regular exercise ( limited by back pain), diabetic diet Continue metformin  to 500 mg daily Started Ozempic  0.25 mg weekly 2 months ago-took for 6 weeks and then stopped it due to cost She has made significant changes in her lifestyle-eating less and eating better and has lost a significant amount of weight and will continue Follow-up in 3 months and will recheck blood work at that time

## 2023-08-07 ENCOUNTER — Ambulatory Visit (INDEPENDENT_AMBULATORY_CARE_PROVIDER_SITE_OTHER)

## 2023-08-07 DIAGNOSIS — E538 Deficiency of other specified B group vitamins: Secondary | ICD-10-CM | POA: Diagnosis not present

## 2023-08-07 MED ORDER — CYANOCOBALAMIN 1000 MCG/ML IJ SOLN
1000.0000 ug | Freq: Once | INTRAMUSCULAR | Status: AC
  Administered 2023-08-07: 1000 ug via INTRAMUSCULAR

## 2023-08-07 NOTE — Progress Notes (Signed)
 Pt here for monthly B12 injection per Dr. Donnette Gal  B12 1000mcg given IM and pt tolerated injection well.  Patient has been scheduled for her next B12 injection.

## 2023-08-28 ENCOUNTER — Ambulatory Visit (INDEPENDENT_AMBULATORY_CARE_PROVIDER_SITE_OTHER)

## 2023-08-28 DIAGNOSIS — E538 Deficiency of other specified B group vitamins: Secondary | ICD-10-CM | POA: Diagnosis not present

## 2023-08-28 MED ORDER — CYANOCOBALAMIN 1000 MCG/ML IJ SOLN
1000.0000 ug | Freq: Once | INTRAMUSCULAR | Status: AC
  Administered 2023-08-28: 1000 ug via INTRAMUSCULAR

## 2023-08-28 NOTE — Progress Notes (Signed)
 Patient visits today for their b-12 injection. Patient informed of what they had received and tolerated injection well. Patient notified to reach out to office if needed.

## 2023-08-31 ENCOUNTER — Ambulatory Visit

## 2023-09-25 ENCOUNTER — Ambulatory Visit

## 2023-09-25 DIAGNOSIS — E538 Deficiency of other specified B group vitamins: Secondary | ICD-10-CM

## 2023-09-25 MED ORDER — CYANOCOBALAMIN 1000 MCG/ML IJ SOLN
1000.0000 ug | Freq: Once | INTRAMUSCULAR | Status: AC
  Administered 2023-09-25: 1000 ug via INTRAMUSCULAR

## 2023-09-25 NOTE — Progress Notes (Signed)
 Pt here for monthly B12 injection per Dr. Donnette Gal.   B12 1000mcg given IM and pt tolerated injection well.  Next B12 injection has been scheduled.

## 2023-10-05 ENCOUNTER — Other Ambulatory Visit: Payer: Self-pay | Admitting: Internal Medicine

## 2023-10-05 DIAGNOSIS — E119 Type 2 diabetes mellitus without complications: Secondary | ICD-10-CM

## 2023-10-13 ENCOUNTER — Encounter: Payer: Self-pay | Admitting: Internal Medicine

## 2023-10-13 NOTE — Progress Notes (Unsigned)
 Subjective:    Patient ID: Paige Phillips, female    DOB: May 17, 1950, 73 y.o.   MRN: [id number]      HPI Paige Phillips is here for a Physical exam and her chronic medical problems.   Continues to lose weight - has lost 15 lb in the past 3 months.  Several months ago she did make several changes in the diet 1 of which included decreasing her Pepsi intake.  She was drinking approximately 1 week he is of Pepsi a week which is the equivalent of 1800-calorie.  She is also made other changes which likely explains the weight loss  She has no concerns besides the weight loss.   Medications and allergies reviewed with patient and updated if appropriate.  Current Outpatient Medications on File Prior to Visit  Medication Sig Dispense Refill   acetaminophen  (TYLENOL ) 650 MG CR tablet Take 1,300 mg by mouth every 8 (eight) hours as needed for pain.     Alpha-Lipoic Acid 600 MG CAPS Take 600 mg by mouth 2 (two) times daily.     amLODipine  (NORVASC ) 5 MG tablet TAKE 1 TABLET(5 MG) BY MOUTH DAILY 90 tablet 3   carvedilol  (COREG ) 6.25 MG tablet Take 1 tablet (6.25 mg total) by mouth 2 (two) times daily with a meal. D/c bystolic  due to cost, d/c 3.125 180 tablet 3   Cholecalciferol  (HM VITAMIN D3) 100 MCG (4000 UT) CAPS Take 4,000 Units by mouth daily.     metFORMIN  (GLUCOPHAGE ) 500 MG tablet Take 1 tablet (500 mg total) by mouth daily with breakfast. 90 tablet 3   omeprazole  (PRILOSEC) 20 MG capsule Take 1 capsule (20 mg total) by mouth daily as needed. 30 capsule 5   pravastatin  (PRAVACHOL ) 20 MG tablet TAKE 1 TABLET(20 MG) BY MOUTH AT BEDTIME 90 tablet 3   Current Facility-Administered Medications on File Prior to Visit  Medication Dose Route Frequency Provider Last Rate Last Admin   cyanocobalamin  (VITAMIN B12) injection 1,000 mcg  1,000 mcg Intramuscular Once Geofm Glade PARAS, MD        Review of Systems  Constitutional:  Negative for fever.  Eyes:  Negative for visual disturbance.   Respiratory:  Negative for cough, shortness of breath and wheezing.   Cardiovascular:  Negative for chest pain, palpitations and leg swelling.  Gastrointestinal:  Negative for abdominal pain, blood in stool, constipation and diarrhea.       No gerd  Genitourinary:  Negative for dysuria.  Musculoskeletal:  Positive for back pain (intermittent). Negative for arthralgias.  Skin:  Negative for rash.  Neurological:  Negative for light-headedness and headaches.  Psychiatric/Behavioral:  Negative for dysphoric mood. The patient is not nervous/anxious.        Objective:   Vitals:   10/14/23 0954 10/14/23 1032  BP: (!) 144/82 126/80  Pulse: 73   Resp: 18   Temp: 98.5 F (36.9 C)    Filed Weights   10/14/23 0954  Weight: 211 lb (95.7 kg)   Body mass index is 36.22 kg/m.  BP Readings from Last 3 Encounters:  10/14/23 126/80  07/14/23 134/70  04/13/23 128/80    Wt Readings from Last 3 Encounters:  10/14/23 211 lb (95.7 kg)  07/14/23 226 lb (102.5 kg)  04/13/23 252 lb (114.3 kg)       Physical Exam Constitutional: She appears well-developed and well-nourished. No distress.  HENT:  Head: Normocephalic and atraumatic.  Right Ear: External ear normal. Normal ear canal and TM Left Ear: External  ear normal.  Normal ear canal and TM Mouth/Throat: Oropharynx is clear and moist.  Eyes: Conjunctivae normal.  Neck: Neck supple. No tracheal deviation present. No thyromegaly present.  No carotid bruit  Cardiovascular: Normal rate, regular rhythm and normal heart sounds.   No murmur heard.  No edema. Pulmonary/Chest: Effort normal and breath sounds normal. No respiratory distress. She has no wheezes. She has no rales.  Breast: deferred   Abdominal: Soft. She exhibits no distension. There is no tenderness.  Lymphadenopathy: She has no cervical adenopathy.  Skin: Skin is warm and dry. She is not diaphoretic.  Psychiatric: She has a normal mood and affect. Her behavior is normal.    Diabetic Foot Exam - Simple   Simple Foot Form Diabetic Foot exam was performed with the following findings: Yes 10/14/2023 10:27 AM  Visual Inspection No deformities, no ulcerations, no other skin breakdown bilaterally: Yes Sensation Testing Intact to touch and monofilament testing bilaterally: Yes Pulse Check Posterior Tibialis and Dorsalis pulse intact bilaterally: Yes Comments      Lab Results  Component Value Date   WBC 10.1 10/10/2022   HGB 13.9 10/10/2022   HCT 44.3 10/10/2022   PLT 315.0 10/10/2022   GLUCOSE 119 (H) 04/13/2023   CHOL 139 04/13/2023   TRIG 84.0 04/13/2023   HDL 47.20 04/13/2023   LDLCALC 75 04/13/2023   ALT 9 04/13/2023   AST 13 04/13/2023   NA 141 04/13/2023   K 3.3 (L) 04/13/2023   CL 108 04/13/2023   CREATININE 0.77 04/13/2023   BUN 9 04/13/2023   CO2 24 04/13/2023   TSH 0.84 05/15/2021   HGBA1C 6.6 (H) 04/13/2023   MICROALBUR 1.2 05/15/2021         Assessment & Plan:   Physical exam: Screening blood work  ordered Exercise  not regular  Weight  obese - losing weight Substance abuse  none   Reviewed recommended immunizations.   Health Maintenance  Topic Date Due   DTaP/Tdap/Td (1 - Tdap) Never done   Zoster Vaccines- Shingrix  (1 of 2) Never done   Diabetic kidney evaluation - Urine ACR  05/16/2022   HEMOGLOBIN A1C  10/11/2023   COVID-19 Vaccine (4 - 2024-25 season) 10/29/2023 (Originally 11/09/2022)   OPHTHALMOLOGY EXAM  01/14/2024   Medicare Annual Wellness (AWV)  04/09/2024   Diabetic kidney evaluation - eGFR measurement  04/12/2024   FOOT EXAM  10/13/2024   MAMMOGRAM  05/21/2025   Colonoscopy  10/06/2030   Pneumococcal Vaccine: 50+ Years  Completed   DEXA SCAN  Completed   Hepatitis C Screening  Completed   Hepatitis B Vaccines  Aged Out   HPV VACCINES  Aged Out   Meningococcal B Vaccine  Aged Out   INFLUENZA VACCINE  Discontinued          See Problem List for Assessment and Plan of chronic medical  problems.

## 2023-10-13 NOTE — Patient Instructions (Addendum)

## 2023-10-14 ENCOUNTER — Ambulatory Visit (INDEPENDENT_AMBULATORY_CARE_PROVIDER_SITE_OTHER): Admitting: Internal Medicine

## 2023-10-14 VITALS — BP 126/80 | HR 73 | Temp 98.5°F | Resp 18 | Ht 64.0 in | Wt 211.0 lb

## 2023-10-14 DIAGNOSIS — E538 Deficiency of other specified B group vitamins: Secondary | ICD-10-CM | POA: Diagnosis not present

## 2023-10-14 DIAGNOSIS — E1159 Type 2 diabetes mellitus with other circulatory complications: Secondary | ICD-10-CM | POA: Diagnosis not present

## 2023-10-14 DIAGNOSIS — E559 Vitamin D deficiency, unspecified: Secondary | ICD-10-CM

## 2023-10-14 DIAGNOSIS — Z0000 Encounter for general adult medical examination without abnormal findings: Secondary | ICD-10-CM | POA: Diagnosis not present

## 2023-10-14 DIAGNOSIS — G6289 Other specified polyneuropathies: Secondary | ICD-10-CM

## 2023-10-14 DIAGNOSIS — E785 Hyperlipidemia, unspecified: Secondary | ICD-10-CM | POA: Diagnosis not present

## 2023-10-14 DIAGNOSIS — I152 Hypertension secondary to endocrine disorders: Secondary | ICD-10-CM | POA: Diagnosis not present

## 2023-10-14 DIAGNOSIS — E1169 Type 2 diabetes mellitus with other specified complication: Secondary | ICD-10-CM

## 2023-10-14 DIAGNOSIS — Z7984 Long term (current) use of oral hypoglycemic drugs: Secondary | ICD-10-CM

## 2023-10-14 LAB — COMPREHENSIVE METABOLIC PANEL WITH GFR
ALT: 8 U/L (ref 0–35)
AST: 12 U/L (ref 0–37)
Albumin: 4.4 g/dL (ref 3.5–5.2)
Alkaline Phosphatase: 74 U/L (ref 39–117)
BUN: 14 mg/dL (ref 6–23)
CO2: 29 meq/L (ref 19–32)
Calcium: 9.7 mg/dL (ref 8.4–10.5)
Chloride: 101 meq/L (ref 96–112)
Creatinine, Ser: 0.93 mg/dL (ref 0.40–1.20)
GFR: 61.42 mL/min (ref >60.00)
Glucose, Bld: 135 mg/dL — ABNORMAL HIGH (ref 70–99)
Potassium: 4.3 meq/L (ref 3.5–5.1)
Sodium: 138 meq/L (ref 135–145)
Total Bilirubin: 0.7 mg/dL (ref 0.2–1.2)
Total Protein: 8.1 g/dL (ref 6.0–8.3)

## 2023-10-14 LAB — LIPID PANEL
Cholesterol: 130 mg/dL (ref 0–200)
HDL: 47.2 mg/dL (ref >39.00)
LDL Cholesterol: 65 mg/dL (ref 0–99)
NonHDL: 82.92
Total CHOL/HDL Ratio: 3
Triglycerides: 91 mg/dL (ref 0.0–149.0)
VLDL: 18.2 mg/dL (ref 0.0–40.0)

## 2023-10-14 LAB — MICROALBUMIN / CREATININE URINE RATIO
Creatinine,U: 184.5 mg/dL
Microalb Creat Ratio: 12 mg/g (ref 0.0–30.0)
Microalb, Ur: 2.2 mg/dL — ABNORMAL HIGH (ref 0.0–1.9)

## 2023-10-14 LAB — VITAMIN B12: Vitamin B-12: 429 pg/mL (ref 211–911)

## 2023-10-14 LAB — HEMOGLOBIN A1C: Hgb A1c MFr Bld: 6.4 % (ref 4.6–6.5)

## 2023-10-14 LAB — CBC WITH DIFFERENTIAL/PLATELET
Basophils Absolute: 0 K/uL (ref 0.0–0.1)
Basophils Relative: 0.4 % (ref 0.0–3.0)
Eosinophils Absolute: 0.1 K/uL (ref 0.0–0.7)
Eosinophils Relative: 1.2 % (ref 0.0–5.0)
HCT: 38.4 % (ref 36.0–46.0)
Hemoglobin: 12.6 g/dL (ref 12.0–15.0)
Lymphocytes Relative: 20.7 % (ref 12.0–46.0)
Lymphs Abs: 1.6 K/uL (ref 0.7–4.0)
MCHC: 32.7 g/dL (ref 30.0–36.0)
MCV: 92.7 fl (ref 78.0–100.0)
Monocytes Absolute: 0.5 K/uL (ref 0.1–1.0)
Monocytes Relative: 6.5 % (ref 3.0–12.0)
Neutro Abs: 5.5 K/uL (ref 1.4–7.7)
Neutrophils Relative %: 71.2 % (ref 43.0–77.0)
Platelets: 260 K/uL (ref 150.0–400.0)
RBC: 4.14 Mil/uL (ref 3.87–5.11)
RDW: 14.7 % (ref 11.5–15.5)
WBC: 7.7 K/uL (ref 4.0–10.5)

## 2023-10-14 LAB — TSH: TSH: 1.2 u[IU]/mL (ref 0.35–5.50)

## 2023-10-14 NOTE — Assessment & Plan Note (Signed)
Chronic Taking vitamin D daily 

## 2023-10-14 NOTE — Assessment & Plan Note (Signed)
 Chronic Doing B12 injections monthly-continue Last B12 injection 09/25/2023

## 2023-10-14 NOTE — Assessment & Plan Note (Signed)
 Chronic Regular exercise and healthy diet encouraged Continue pravastatin  20 mg daily Check lipid panel, CMP, TSH  Lab Results  Component Value Date   LDLCALC 75 04/13/2023

## 2023-10-14 NOTE — Assessment & Plan Note (Signed)
 Chronic Intermittent tingling in bottom of feet

## 2023-10-14 NOTE — Assessment & Plan Note (Signed)
 Chronic With hyperlipidemia, obesity  Lab Results  Component Value Date   HGBA1C 6.6 (H) 04/13/2023   Sugars controlled-improved A1c, urine albumin /creatinine ratio Stressed regular exercise ( limited by back pain), diabetic diet Continue metformin  to 500 mg daily Ozempic  not affordable Working on weight loss-continue diabetic diet, decrease portions

## 2023-10-14 NOTE — Assessment & Plan Note (Addendum)
 Chronic Weight 252 pounds on 04/13/2023 - currently 226 lb Limited with exercise secondary to chronic back pain-encouraged to be as low as possible Eating less and eating better-has stopped drinking soda almost completely-does have 1 once in a while She continues to lose weight and she was concerned if there was something wrong-I do feel that her weight loss is likely explained by her significant decrease calorie intake Will monitor-advised her to monitor home Continue weight loss efforts

## 2023-10-14 NOTE — Assessment & Plan Note (Addendum)
 Chronic Blood pressure initially elevated-repeat is well controlled CBC, CMP Restart amlodipine  5 mg daily, Coreg  6.25 mg twice daily Monitor BP at home

## 2023-10-15 ENCOUNTER — Ambulatory Visit: Payer: Self-pay | Admitting: Internal Medicine

## 2023-10-26 ENCOUNTER — Ambulatory Visit (INDEPENDENT_AMBULATORY_CARE_PROVIDER_SITE_OTHER)

## 2023-10-26 DIAGNOSIS — E538 Deficiency of other specified B group vitamins: Secondary | ICD-10-CM

## 2023-10-26 MED ORDER — CYANOCOBALAMIN 1000 MCG/ML IJ SOLN
1000.0000 ug | Freq: Once | INTRAMUSCULAR | Status: AC
  Administered 2023-10-26: 1000 ug via INTRAMUSCULAR

## 2023-10-26 NOTE — Progress Notes (Signed)
 Patient here for monthlyt B-12 injection. Patient has tolerated well with no complications. Next monthly b-12 has been scheduled

## 2023-11-26 ENCOUNTER — Ambulatory Visit: Admitting: Radiology

## 2023-11-26 DIAGNOSIS — E538 Deficiency of other specified B group vitamins: Secondary | ICD-10-CM | POA: Diagnosis not present

## 2023-11-26 MED ORDER — CYANOCOBALAMIN 1000 MCG/ML IJ SOLN: 1000.0000 ug | Freq: Once | INTRAMUSCULAR | Status: DC

## 2023-11-26 NOTE — Progress Notes (Signed)
 Patient here for monthly B12 injection. Patient tolerated well no complications.

## 2023-12-28 ENCOUNTER — Ambulatory Visit (INDEPENDENT_AMBULATORY_CARE_PROVIDER_SITE_OTHER)

## 2023-12-28 ENCOUNTER — Other Ambulatory Visit: Payer: Self-pay | Admitting: Internal Medicine

## 2023-12-28 DIAGNOSIS — E538 Deficiency of other specified B group vitamins: Secondary | ICD-10-CM | POA: Diagnosis not present

## 2023-12-28 DIAGNOSIS — E119 Type 2 diabetes mellitus without complications: Secondary | ICD-10-CM

## 2023-12-28 MED ORDER — CYANOCOBALAMIN 1000 MCG/ML IJ SOLN
1000.0000 ug | Freq: Once | INTRAMUSCULAR | Status: AC
  Administered 2023-12-28: 1000 ug via INTRAMUSCULAR

## 2023-12-28 NOTE — Progress Notes (Signed)
 Pt was given B12 injection w/o any complications at this time.

## 2024-01-28 ENCOUNTER — Ambulatory Visit (INDEPENDENT_AMBULATORY_CARE_PROVIDER_SITE_OTHER)

## 2024-01-28 DIAGNOSIS — E538 Deficiency of other specified B group vitamins: Secondary | ICD-10-CM | POA: Diagnosis not present

## 2024-01-28 MED ORDER — CYANOCOBALAMIN 1000 MCG/ML IJ SOLN
1000.0000 ug | Freq: Once | INTRAMUSCULAR | Status: AC
  Administered 2024-01-28: 1000 ug via INTRAMUSCULAR

## 2024-01-28 NOTE — Progress Notes (Signed)
Patient received B12 injection today, tolerated well.

## 2024-02-17 ENCOUNTER — Ambulatory Visit (INDEPENDENT_AMBULATORY_CARE_PROVIDER_SITE_OTHER)

## 2024-02-17 DIAGNOSIS — E538 Deficiency of other specified B group vitamins: Secondary | ICD-10-CM | POA: Diagnosis not present

## 2024-02-17 DIAGNOSIS — Z860100 Personal history of colon polyps, unspecified: Secondary | ICD-10-CM | POA: Insufficient documentation

## 2024-02-17 DIAGNOSIS — K259 Gastric ulcer, unspecified as acute or chronic, without hemorrhage or perforation: Secondary | ICD-10-CM | POA: Insufficient documentation

## 2024-02-17 DIAGNOSIS — F32A Depression, unspecified: Secondary | ICD-10-CM | POA: Insufficient documentation

## 2024-02-17 MED ORDER — CYANOCOBALAMIN 1000 MCG/ML IJ SOLN
1000.0000 ug | Freq: Once | INTRAMUSCULAR | Status: AC
  Administered 2024-02-17: 1000 ug via INTRAMUSCULAR

## 2024-02-17 NOTE — Progress Notes (Signed)
 Pt was given B12 injection w/o any complications at this time.

## 2024-03-21 ENCOUNTER — Ambulatory Visit

## 2024-03-21 DIAGNOSIS — E538 Deficiency of other specified B group vitamins: Secondary | ICD-10-CM

## 2024-03-21 MED ORDER — CYANOCOBALAMIN 1000 MCG/ML IJ SOLN
1000.0000 ug | Freq: Once | INTRAMUSCULAR | Status: AC
  Administered 2024-03-21: 1000 ug via INTRAMUSCULAR

## 2024-03-21 NOTE — Progress Notes (Signed)
Pt was given B12 injection w/o any complications. 

## 2024-04-11 ENCOUNTER — Ambulatory Visit: Payer: Medicare Other

## 2024-04-15 ENCOUNTER — Ambulatory Visit: Admitting: Internal Medicine

## 2024-04-15 ENCOUNTER — Ambulatory Visit

## 2024-04-21 ENCOUNTER — Ambulatory Visit

## 2024-04-27 ENCOUNTER — Ambulatory Visit: Admitting: Internal Medicine

## 2024-10-14 ENCOUNTER — Encounter: Admitting: Internal Medicine
